# Patient Record
Sex: Female | Born: 2000
Health system: Southern US, Community
[De-identification: ages and names within clinical notes are randomized; demographics above are authoritative.]

## PROBLEM LIST (undated history)

## (undated) DIAGNOSIS — F32A Depression, unspecified: Secondary | ICD-10-CM

## (undated) DIAGNOSIS — F419 Anxiety disorder, unspecified: Secondary | ICD-10-CM

## (undated) DIAGNOSIS — F329 Major depressive disorder, single episode, unspecified: Secondary | ICD-10-CM

## (undated) HISTORY — DX: Depression, unspecified: F32.A

## (undated) HISTORY — PX: NO PAST SURGERIES: SHX2092

---
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
## 2019-01-09 ENCOUNTER — Ambulatory Visit (HOSPITAL_COMMUNITY): Payer: Federal, State, Local not specified - PPO | Admitting: Licensed Clinical Social Worker

## 2019-01-09 DIAGNOSIS — F5102 Adjustment insomnia: Secondary | ICD-10-CM

## 2019-01-09 DIAGNOSIS — Z639 Problem related to primary support group, unspecified: Secondary | ICD-10-CM

## 2019-01-09 DIAGNOSIS — F411 Generalized anxiety disorder: Secondary | ICD-10-CM

## 2019-01-09 DIAGNOSIS — F332 Major depressive disorder, recurrent severe without psychotic features: Secondary | ICD-10-CM

## 2019-01-09 NOTE — Progress Notes (Signed)
Virtual Visit via Video Note  I connected with Leah Cervantes on 01/09/19 at  3:00 PM EDT by a video enabled telemedicine application and verified that I am speaking with the correct person using two identifiers.   I discussed the limitations of evaluation and management by telemedicine and the availability of in person appointments. The patient expressed understanding and agreed to proceed.   Follow Up Instructions:    I discussed the assessment and treatment plan with the patient. The patient was provided an opportunity to ask questions and all were answered. The patient agreed with the plan and demonstrated an understanding of the instructions.   The patient was advised to call back or seek an in-person evaluation if the symptoms worsen or if the condition fails to improve as anticipated.  I provided 56 minutes of non-face-to-face time during this encounter.  THERAPIST PROGRESS NOTE  Session Time: 3:02 PM to 3:58 PM  Participation Level: Active  Behavioral Response: CasualAlertDepressed  Type of Therapy: Individual Therapy  Treatment Goals addressed:  patient will work on communicating her feelings, being honest with herself, compromising with herself, coping  Interventions: Solution Focused, Strength-based, Supportive, Reframing and Other: coping  Summary: Leah Cervantes is a 18 y.o. female who presents with an emotional wreck today. My cat passed away and he was my baby. All of sudden and only two years old. Picked a spot in my friend's back yard. I wrote a note and put in his box, planted flowers and put a stone on it. Will put cat nip in with it when it is in season.  Reviewed what she can do now that will be comforting to her and she is going back to best friend's house. Thre is a guy, Ty, love him but because of issues with Dad can't be with him, important part of my life and he will be there and will comfort me and make me smile.  Patient shared qualities of her  relationship that he is goofy, doesn't accept or judge me, listens to  me, tries to understand me and not upset when I am having a bad day. Understands her based on similarities they have. He knows me so well. Dad doesn't like him because he is half black. Mom and stepdad like him and thinks he treats me well.  Discussed he has spontaneous, and enjoys these times and shared about fun activities they do together.  They have been romantically involved since January but knew each other before that.  I did tell my dad for quite a few months. I knew that it would be an issue. He is older than me, he is 57. Marland Kitchen When I told dad he said very mean things and that I had to choose.  I cut them off for a while because trying to distance myself.  I am good at turning off my emotions when distancing myself. I really missed him, started hanging out again like we never stopped. My dad hasn't asked me about it not an issue until he does. It is what it is. I want to be happy. There are a lot of things in life that making me unhappy.  Discussed cutting incidents and shared she did on Saturday, that was the last time before that it did really good then got a huge argument with stepdad. I am trying for that to be the last time, thought about it since then but happen.  Reviewed strategies to find alternatives and she is agrees that  talking to tie helps her to calm down.  Have been honest with my parents. Reviewed list of what makes patient unhappy and one is that she was fired from her job.  Therapist pointed out last session she shared she hated her job and and was very stressful for her.  Relates mindset switched and enjoyed it again.  Working on self and being around Ty helps. When he saw my arm he was heartbroken. Hugged me, and said stop hurting yourself, stop doing that to yourself. I want to be happy, I don't want feel this way, going to be happy.  I do not like to make him unhappy and I am also doing it for me.  Relates she has  set note to corporate to contest her firing, reviewed other reasons that she had points was having really bad days and could not get out of bed.  Therapist relates how that will be different when she gets her job back.  Shares it is all about mindset and keep it where it needs to be and I will have problems at work.  Instead of it being an emotional toll thinking about every minute and every hour earning money, building blocks from my future,  want future, and want to work hard for it.   Reviewed session and patient shares that she is making steps in the right direction.  She began another discussion at end of session about opening up to parents about something that happened to her and therapist suggested starting that next session so fully could discuss. Suicidal/Homicidal: No  Therapist Response: Therapist assessed patient current functioning per report and processed feelings related to significant stressor of Passing way.  Provided positive feedback for her taking active steps to help her with coping by creating a memorial and varying the cat.  Identified this as a coping strategy in general that is positive the patient uses.  Discussed Memorial's for people and animals as important in helping this grief, memory how important they are not her life also as a way of finding connection with the loss.  Provided positive feedback for patient spending time with supports later today that will provide comfort to help her get through difficult emotions.   Provided positive feedback for patient's progress in working months expressing her emotions, recognizing mindset is important in helping her cope, helping her move away from destructive behaviors.  Reviewed that it is the meaning that we give to an event that affects how we feel, that patient's mind showed at work has shifted toward a more positive, helpful and accurate perspective that will turn her to positive behaviors.  Helpful to do in general to make choices  and cope and ways that are more beneficial for herself and for interactions with others.  ent in making that make happy, choices that are going to be harmful Event but our interpretation that impacts how we are going to feel. Coping by taking action Plan: Return again in 2 weeks.2.  Therapist continue to help patient learn mood regulation skills, utilize sessions to help patient open up about feelings and help with coping skills enhancement  Diagnosis: Axis I:  severe episode of recurrent major depressive disorder without psychotic features, adjustment insomnia, relationship dysfunction, Generalized Anxiety disorder    Axis II: No diagnosis    Coolidge BreezeMary Latona Krichbaum, LCSW 01/09/2019

## 2019-01-24 ENCOUNTER — Ambulatory Visit (INDEPENDENT_AMBULATORY_CARE_PROVIDER_SITE_OTHER): Payer: Federal, State, Local not specified - PPO | Admitting: Licensed Clinical Social Worker

## 2019-01-24 ENCOUNTER — Other Ambulatory Visit: Payer: Self-pay

## 2019-01-24 DIAGNOSIS — F332 Major depressive disorder, recurrent severe without psychotic features: Secondary | ICD-10-CM

## 2019-01-24 DIAGNOSIS — F411 Generalized anxiety disorder: Secondary | ICD-10-CM

## 2019-01-24 DIAGNOSIS — F5102 Adjustment insomnia: Secondary | ICD-10-CM

## 2019-01-24 DIAGNOSIS — Z639 Problem related to primary support group, unspecified: Secondary | ICD-10-CM | POA: Diagnosis not present
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
## 2019-01-25 ENCOUNTER — Encounter: Payer: Self-pay | Admitting: Family Medicine

## 2019-01-25 ENCOUNTER — Other Ambulatory Visit: Payer: Self-pay

## 2019-01-25 ENCOUNTER — Telehealth: Payer: Self-pay

## 2019-01-25 DIAGNOSIS — R6889 Other general symptoms and signs: Secondary | ICD-10-CM | POA: Diagnosis not present

## 2019-01-25 DIAGNOSIS — Z20822 Contact with and (suspected) exposure to covid-19: Secondary | ICD-10-CM

## 2019-01-25 NOTE — Telephone Encounter (Signed)
Leah Cervantes states she has had a possible exposure to COVID-19. I did give her the address to the [facility] testing center. Denies any symptoms.

## 2019-01-25 NOTE — Telephone Encounter (Signed)
Thank you :)

## 2019-01-26 LAB — NOVEL CORONAVIRUS, NAA: SARS-CoV-2, NAA: NOT DETECTED

## 2019-01-27 ENCOUNTER — Encounter: Payer: Self-pay | Admitting: Family Medicine

## 2019-01-28 NOTE — Telephone Encounter (Signed)
Ok for work note to return.

## 2019-01-29 ENCOUNTER — Encounter: Payer: Self-pay | Admitting: Family Medicine

## 2019-02-04 ENCOUNTER — Encounter (HOSPITAL_COMMUNITY): Payer: Self-pay | Admitting: Psychiatry

## 2019-02-04 ENCOUNTER — Ambulatory Visit (INDEPENDENT_AMBULATORY_CARE_PROVIDER_SITE_OTHER): Payer: Federal, State, Local not specified - PPO | Admitting: Psychiatry

## 2019-02-04 DIAGNOSIS — F411 Generalized anxiety disorder: Secondary | ICD-10-CM | POA: Diagnosis not present

## 2019-02-04 DIAGNOSIS — Z639 Problem related to primary support group, unspecified: Secondary | ICD-10-CM | POA: Diagnosis not present

## 2019-02-04 DIAGNOSIS — F5102 Adjustment insomnia: Secondary | ICD-10-CM

## 2019-02-04 DIAGNOSIS — F332 Major depressive disorder, recurrent severe without psychotic features: Secondary | ICD-10-CM

## 2019-02-04 MED ORDER — DULOXETINE HCL 30 MG PO CPEP: 30.0000 mg | ORAL_CAPSULE | Freq: Two times a day (BID) | ORAL | 1 refills | Status: DC

## 2019-02-04 MED ORDER — TRAZODONE HCL 50 MG PO TABS: 50.0000 mg | ORAL_TABLET | Freq: Every evening | ORAL | 0 refills | Status: DC | PRN

## 2019-02-04 NOTE — Progress Notes (Signed)
[facility] Follow up visit   Patient Identification: Leah Cervantes MRN:  [id number] Date of Evaluation:  02/04/2019 Referral Source: primary care Chief Complaint:   depression follow up  Visit Diagnosis:    ICD-10-CM   1. Severe episode of recurrent major depressive disorder, without psychotic features (HCC)  F33.2   2. Adjustment insomnia  F51.02   3. Relationship dysfunction  Z63.9   4. Generalized anxiety disorder  F41.1   .  I connected with Leah Cervantes on 02/04/19 at 10:30 AM EDT by telephone and verified that I am speaking with the correct person using two identifiers.   I discussed the limitations, risks, security and privacy concerns of performing an evaluation and management service by telephone and the availability of in person appointments. I also discussed with the patient that there may be a patient responsible charge related to this service. The patient expressed understanding and agreed to proceed.  History of Present Illness:   Leah Cervantes is a 18 years old currently single Caucasian female living with her mom and at times with her dad   Her mom is deaf .  Doing better since cymbalta increased Not overwhelemed, still difficult with dad to satisfy him in general and now regarding her BF   Energy, mood better   Aggravating factors; difficult relationship with dad.  her stepdad is good with her Modifying factors mom, work  Duration since middle school she has suffered from depression on and off factors being considered and relationship  Family history of depression bipolar Severe depression is better  Denies physical sexual trauma but has difficult time going up between parents  Substance Abuse History in the last 12 months:  Yes.    Consequences of Substance Abuse: uses marijuana sporadically, can effect depression and judjement  Past Medical History: History reviewed. No pertinent past medical history. History reviewed. No pertinent surgical history.  Family  Psychiatric History: Uncle bipolar  Family History:  Family History  Problem Relation Age of Onset  . Hearing loss Mother   . Hypertension Mother   . Fibromyalgia Sister   . Stroke Maternal Grandmother   . Heart disease Maternal Grandmother   . Hypertension Maternal Grandmother   . Fibromyalgia Maternal Grandfather   . Raynaud syndrome Paternal Grandmother   . Heart attack Paternal Grandfather   . Bipolar disorder Maternal Uncle     Social History:   Social History   Socioeconomic History  . Marital status: Single    Spouse name: Not on file  . Number of children: Not on file  . Years of education: Not on file  . Highest education level: Not on file  Occupational History  . Not on file  Social Needs  . Financial resource strain: Not on file  . Food insecurity    Worry: Not on file    Inability: Not on file  . Transportation needs    Medical: Not on file    Non-medical: Not on file  Tobacco Use  . Smoking status: Never Smoker  . Smokeless tobacco: Never Used  Substance and Sexual Activity  . Alcohol use: Yes    Comment: occasional/very rare  . Drug use: No  . Sexual activity: Not on file  Lifestyle  . Physical activity    Days per week: Not on file    Minutes per session: Not on file  . Stress: Not on file  Relationships  . Social connections    Talks on phone: Not on file  Gets together: Not on file    Attends religious service: Not on file    Active member of club or organization: Not on file    Attends meetings of clubs or organizations: Not on file    Relationship status: Not on file  Other Topics Concern  . Not on file  Social History Narrative  . Not on file     Allergies:  No Known Allergies  Metabolic Disorder Labs: Lab Results  Component Value Date   HGBA1C 5.3 11/10/2018   MPG 105.41 11/10/2018   No results found for: PROLACTIN Lab Results  Component Value Date   CHOL 147 11/10/2018   TRIG 122 11/10/2018   HDL 36 (L) 11/10/2018    CHOLHDL 4.1 11/10/2018   VLDL 24 11/10/2018   LDLCALC 87 11/10/2018   Lab Results  Component Value Date   TSH 2.374 11/10/2018    Therapeutic Level Labs: No results found for: LITHIUM No results found for: CBMZ No results found for: VALPROATE  Current Medications: Current Outpatient Medications  Medication Sig Dispense Refill  . DULoxetine (CYMBALTA) 30 MG capsule Take 1 capsule (30 mg total) by mouth 2 (two) times daily. 60 capsule 1  . nicotine (NICODERM CQ - DOSED IN MG/24 HR) 7 mg/24hr patch Place 1 patch (7 mg total) onto the skin daily. 28 patch 0  . traZODone (DESYREL) 50 MG tablet Take 1 tablet (50 mg total) by mouth at bedtime and may repeat dose one time if needed. 30 tablet 0   No current facility-administered medications for this visit.      Psychiatric Specialty Exam: Review of Systems  Cardiovascular: Negative for chest pain.  Psychiatric/Behavioral: Negative for depression and suicidal ideas.    There were no vitals taken for this visit.There is no height or weight on file to calculate BMI.  General Appearance:   Eye Contact:    Speech:  Slow  Volume:  Decreased  Mood:  better  Affect:  Congruent  Thought Process:  Goal Directed  Orientation:  Full (Time, Place, and Person)  Thought Content:  Rumination  Suicidal Thoughts:  No  Homicidal Thoughts:  No  Memory:  Immediate;   Fair Recent;   Fair  Judgement:  Fair  Insight:  Fair  Psychomotor Activity:  Decreased  Concentration:  Concentration: Fair and Attention Span: Fair  Recall:  [place] of Knowledge:Good  Language: Good  Akathisia:  No  Handed:  Right  AIMS (if indicated):  not done  Assets:  Desire for Improvement  ADL's:  Intact  Cognition: WNL  Sleep:  variable   Screenings: AIMS     Admission (Discharged) from OP Visit from 11/09/2018 in [place] [facility]  AIMS Total Score  0    AUDIT     Admission (Discharged) from OP Visit from 11/09/2018 in  [place] [facility]  Alcohol Use Disorder Identification Test Final Score (AUDIT)  4    GAD-7     Office Visit from 08/10/2018 in [facility] Office Visit from 07/04/2017 in [facility] Office Visit from 02/02/2017 in [place] Office Visit from 10/17/2016 in [place] Visit from 08/26/2016 in [place]  Total GAD-7 Score  18  6  21  10  18     PHQ2-9     Office Visit from 08/10/2018  in [facility] Office Visit from 07/04/2017 in [facility] Office Visit from 04/13/2017 in [facility] Office Visit from 02/02/2017 in [facility] Office Visit from 10/17/2016 in [facility]  PHQ-2 Total Score  6  0  4  6  3   PHQ-9 Total Score  26  7  22  25  16       Assessment and Plan: as follows MDD recurrent severe; better continue bid cymbalta marijuana or any use of alcohol which she does uses 2-3 times a month May consider to add remeorn if needed. For now continue cymbalta  Insomnia": doing better on prn trazadone. Will continue And therapy to work on relationship with dad I discussed the assessment and treatment plan with the patient. The patient was provided an opportunity to ask questions and all were answered. The patient agreed with the plan and demonstrated an understanding of the instructions.   The patient was advised to call back or seek an in-person evaluation if the symptoms worsen or if the condition fails to improve as anticipated.  Fu 89m.  Thresa Ross, MD 8/31/202010:38 AM

## 2019-02-28 ENCOUNTER — Ambulatory Visit (INDEPENDENT_AMBULATORY_CARE_PROVIDER_SITE_OTHER): Payer: Federal, State, Local not specified - PPO | Admitting: Licensed Clinical Social Worker

## 2019-02-28 DIAGNOSIS — F332 Major depressive disorder, recurrent severe without psychotic features: Secondary | ICD-10-CM

## 2019-02-28 DIAGNOSIS — F411 Generalized anxiety disorder: Secondary | ICD-10-CM

## 2019-02-28 DIAGNOSIS — Z639 Problem related to primary support group, unspecified: Secondary | ICD-10-CM | POA: Diagnosis not present

## 2019-02-28 DIAGNOSIS — F5102 Adjustment insomnia: Secondary | ICD-10-CM | POA: Diagnosis not present
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
## 2019-03-02 ENCOUNTER — Other Ambulatory Visit (HOSPITAL_COMMUNITY): Payer: Self-pay | Admitting: Psychiatry

## 2019-03-25 ENCOUNTER — Ambulatory Visit (INDEPENDENT_AMBULATORY_CARE_PROVIDER_SITE_OTHER): Payer: Federal, State, Local not specified - PPO | Admitting: Licensed Clinical Social Worker

## 2019-03-25 DIAGNOSIS — F411 Generalized anxiety disorder: Secondary | ICD-10-CM | POA: Diagnosis not present

## 2019-03-25 DIAGNOSIS — F332 Major depressive disorder, recurrent severe without psychotic features: Secondary | ICD-10-CM | POA: Diagnosis not present

## 2019-03-25 DIAGNOSIS — F5102 Adjustment insomnia: Secondary | ICD-10-CM

## 2019-03-25 DIAGNOSIS — Z639 Problem related to primary support group, unspecified: Secondary | ICD-10-CM | POA: Diagnosis not present
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
## 2019-04-04 ENCOUNTER — Encounter (HOSPITAL_COMMUNITY): Payer: Self-pay | Admitting: Psychiatry

## 2019-04-04 ENCOUNTER — Ambulatory Visit (INDEPENDENT_AMBULATORY_CARE_PROVIDER_SITE_OTHER): Payer: Federal, State, Local not specified - PPO | Admitting: Psychiatry

## 2019-04-04 ENCOUNTER — Other Ambulatory Visit: Payer: Self-pay

## 2019-04-04 DIAGNOSIS — F411 Generalized anxiety disorder: Secondary | ICD-10-CM | POA: Diagnosis not present

## 2019-04-04 DIAGNOSIS — F332 Major depressive disorder, recurrent severe without psychotic features: Secondary | ICD-10-CM

## 2019-04-04 DIAGNOSIS — F5102 Adjustment insomnia: Secondary | ICD-10-CM

## 2019-04-04 DIAGNOSIS — Z639 Problem related to primary support group, unspecified: Secondary | ICD-10-CM

## 2019-04-04 MED ORDER — DULOXETINE HCL 30 MG PO CPEP: 30.0000 mg | ORAL_CAPSULE | Freq: Three times a day (TID) | ORAL | 1 refills | Status: DC

## 2019-04-04 NOTE — Progress Notes (Signed)
[facility] Follow up visit   Patient Identification: Leah Cervantes MRN:  [id number] Date of Evaluation:  04/04/2019 Referral Source: primary care Chief Complaint:   depression follow up  Visit Diagnosis:    ICD-10-CM   1. Severe episode of recurrent major depressive disorder, without psychotic features (HCC)  F33.2   2. Adjustment insomnia  F51.02   3. Relationship dysfunction  Z63.9   4. Generalized anxiety disorder  F41.1   .   I connected with [name] on 04/04/19 at 10:30 AM EDT by a video enabled telemedicine application and verified that I am speaking with the correct person using two identifiers.      I discussed the limitations, risks, security and privacy concerns of performing an evaluation and management service by telephone and the availability of in person appointments. I also discussed with the patient that there may be a patient responsible charge related to this service. The patient expressed understanding and agreed to proceed.  History of Present Illness:   Leah Cervantes is a 18 years old currently single Caucasian female living with her mom and at times with her dad   Her mom is deaf .  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  her stepdad is good with her Modifying factors: mom, work Duration since middle school she has suffered from depression on and off factors being considered and relationship  Family history of depression bipolar Severe depression is better  Denies physical sexual trauma but has difficult time going up between parents  Substance Abuse History in the last 12 months:  Yes.    Consequences of Substance Abuse: uses marijuana sporadically, can effect depression and judjement  Past Medical History:  History reviewed. No pertinent past medical history. History reviewed. No pertinent surgical history.  Family Psychiatric History: Uncle bipolar  Family History:  Family History  Problem Relation Age of Onset  . Hearing loss Mother   . Hypertension Mother   . Fibromyalgia Sister   . Stroke Maternal Grandmother   . Heart disease Maternal Grandmother   . Hypertension Maternal Grandmother   . Fibromyalgia Maternal Grandfather   . Raynaud syndrome Paternal Grandmother   . Heart attack Paternal Grandfather   . Bipolar disorder Maternal Uncle     Social History:   Social History   Socioeconomic History  . Marital status: Single    Spouse name: Not on file  . Number of children: Not on file  . Years of education: Not on file  . Highest education level: Not on file  Occupational History  . Not on file  Social Needs  . Financial resource strain: Not on file  . Food insecurity    Worry: Not on file    Inability: Not on file  . Transportation needs    Medical: Not on file    Non-medical: Not on file  Tobacco Use  . Smoking status: Never Smoker  . Smokeless tobacco: Never Used  Substance and Sexual Activity  . Alcohol use: Yes    Comment: occasional/very rare  . Drug use: No  . Sexual activity: Not on file  Lifestyle  . Physical activity    Days per week: Not on file    Minutes per session: Not on file  . Stress: Not on  file  Relationships  . Social Herbalist on phone: Not on file    Gets together: Not on file    Attends religious service: Not on file    Active member of club or organization: Not on file    Attends meetings of clubs or organizations: Not on file    Relationship status: Not on file  Other Topics Concern  . Not on file  Social History Narrative  . Not on file     Allergies:  No Known Allergies  Metabolic Disorder Labs: Lab Results  Component Value Date   HGBA1C 5.3 11/10/2018   MPG 105.41 11/10/2018   No results found for:  PROLACTIN Lab Results  Component Value Date   CHOL 147 11/10/2018   TRIG 122 11/10/2018   HDL 36 (L) 11/10/2018   CHOLHDL 4.1 11/10/2018   VLDL 24 11/10/2018   LDLCALC 87 11/10/2018   Lab Results  Component Value Date   TSH 2.374 11/10/2018    Therapeutic Level Labs: No results found for: LITHIUM No results found for: CBMZ No results found for: VALPROATE  Current Medications: Current Outpatient Medications  Medication Sig Dispense Refill  . DULoxetine (CYMBALTA) 30 MG capsule Take 1 capsule (30 mg total) by mouth 3 (three) times daily. 90 capsule 1  . nicotine (NICODERM CQ - DOSED IN MG/24 HR) 7 mg/24hr patch Place 1 patch (7 mg total) onto the skin daily. 28 patch 0  . traZODone (DESYREL) 50 MG tablet TAKE 1 TABLET (50 MG TOTAL) BY MOUTH AT BEDTIME AND MAY REPEAT DOSE ONE TIME IF NEEDED. 30 tablet 0   No current facility-administered medications for this visit.      Psychiatric Specialty Exam: Review of Systems  Cardiovascular: Negative for chest pain.  Psychiatric/Behavioral: Negative for suicidal ideas. The patient is nervous/anxious.     There were no vitals taken for this visit.There is no height or weight on file to calculate BMI.  General Appearance:   Eye Contact:    Speech:  Slow  Volume:  Decreased  Mood:  Somewhat stressed  Affect:  Congruent  Thought Process:  Goal Directed  Orientation:  Full (Time, Place, and Person)  Thought Content:  Rumination  Suicidal Thoughts:  No  Homicidal Thoughts:  No  Memory:  Immediate;   Fair Recent;   Fair  Judgement:  Fair  Insight:  Fair  Psychomotor Activity:  Decreased  Concentration:  Concentration: Fair and Attention Span: Fair  Recall:  [employer] of Knowledge:Good  Language: Good  Akathisia:  No  Handed:  Right  AIMS (if indicated):  not done  Assets:  Desire for Improvement  ADL's:  Intact  Cognition: WNL  Sleep:  variable   Screenings: AIMS     Admission (Discharged) from OP Visit from 11/09/2018  in [place] [facility]  AIMS Total Score  0    AUDIT     Admission (Discharged) from OP Visit from 11/09/2018 in [place] [facility]  Alcohol Use Disorder Identification Test Final Score (AUDIT)  4    GAD-7     Office Visit from 08/10/2018 in [facility] Office Visit from 07/04/2017 in [facility] Office Visit from 02/02/2017 in [place] Office Visit from 10/17/2016 in [place] Visit from 08/26/2016 in [place]  Total GAD-7  Score  18  6  21  10  18     PHQ2-9     [facility] [place] [facility] [place] [facility] [place] [facility] [place] [facility] [place]  PHQ-2 Total Score  6  0  4  6  3   PHQ-9 Total Score  26  7  22  25  16       Assessment and Plan: as follows MDD recurrent severe; somewhat subdued, increase cymbalta to 30mg  tid Abstain from marijuana or any use of alcohol which she does uses 2-3 times a month May consider to add remeorn if needed.  Insomnia": doing better on prn trazadone. Will continue and can ask for refills Continue therapy to work on past incident and relationship with dad  I discussed the assessment and treatment plan with the patient. The patient was provided an opportunity to ask questions and all were answered. The patient agreed with the plan and demonstrated an understanding of the instructions.   The patient was advised to call back or seek an in-person evaluation if the symptoms worsen or if the condition fails to improve as  anticipated. Fu [id number].  Thresa RossNadeem Audriella Blakeley, MD 10/29/202010:43 AM

## 2019-04-17 ENCOUNTER — Other Ambulatory Visit: Payer: Self-pay

## 2019-04-17 ENCOUNTER — Ambulatory Visit (HOSPITAL_COMMUNITY): Payer: Federal, State, Local not specified - PPO | Admitting: Licensed Clinical Social Worker

## 2019-04-30 ENCOUNTER — Encounter: Payer: Self-pay | Admitting: Osteopathic Medicine

## 2019-04-30 ENCOUNTER — Ambulatory Visit (INDEPENDENT_AMBULATORY_CARE_PROVIDER_SITE_OTHER): Payer: Federal, State, Local not specified - PPO | Admitting: Osteopathic Medicine

## 2019-04-30 ENCOUNTER — Other Ambulatory Visit: Payer: Self-pay

## 2019-04-30 ENCOUNTER — Ambulatory Visit (INDEPENDENT_AMBULATORY_CARE_PROVIDER_SITE_OTHER): Payer: Federal, State, Local not specified - PPO

## 2019-04-30 VITALS — BP 121/80 | HR 97 | Temp 98.3°F | Wt 234.0 lb

## 2019-04-30 DIAGNOSIS — S39012A Strain of muscle, fascia and tendon of lower back, initial encounter: Secondary | ICD-10-CM

## 2019-04-30 DIAGNOSIS — M545 Low back pain: Secondary | ICD-10-CM | POA: Diagnosis not present

## 2019-04-30 DIAGNOSIS — Z23 Encounter for immunization: Secondary | ICD-10-CM | POA: Diagnosis not present

## 2019-04-30 MED ORDER — NAPROXEN 500 MG PO TABS: 500.0000 mg | ORAL_TABLET | Freq: Two times a day (BID) | ORAL | 1 refills | Status: DC

## 2019-04-30 MED ORDER — CYCLOBENZAPRINE HCL 10 MG PO TABS: 5.0000 mg | ORAL_TABLET | Freq: Three times a day (TID) | ORAL | 1 refills | Status: DC | PRN

## 2019-04-30 MED ORDER — PREDNISONE 10 MG (48) PO TBPK: ORAL_TABLET | Freq: Every day | ORAL | 0 refills | Status: DC

## 2019-04-30 NOTE — Progress Notes (Signed)
HPI: Leah Cervantes is a 18 y.o. female who  has no past medical history on file.  she presents to [facility] today, 04/30/19,  for chief complaint of:  Back pain   . 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 Location: lower back, cental, radiating laterally, worse on L  . Quality: soreness/sharp pain, NO numbness or weakness . Severity: so bad she was unable to work on Sunday, has been having bad days more frequently . Duration: 1.5 months total . Modifying factors: Advil, Motrin not helping . Assoc signs/symptoms: numbness/tingling     At today's visit 04/30/19 ... PMH, PSH, FH reviewed and updated as needed.  Current medication list and allergy/intolerance hx reviewed and updated as needed. (See remainder of HPI, ROS, Phys Exam below)   No results found.  No results found for this or any previous visit (from the past 72 hour(s)).        ASSESSMENT/PLAN: The primary encounter diagnosis was Strain of lumbar region, initial encounter. A diagnosis of Need for influenza vaccination was also pertinent to this visit.   No alarm symptoms at this time  Pt to alert [place] if worse/change  Written out of work for several days  Consider restricted duty to avoid lifting   Home exercises given, would probably recommend formal PT if pt ok with it but she'd like to defer for now   Orders Placed This Encounter  Procedures  . DG Lumbar Spine Complete  . DG Si Joints  . Flu Vaccine QUAD 6+ mos PF IM (Fluarix Quad PF)      Patient Instructions   If BACK PAIN is not better or if it gets worse despite medications and home exercises, let me know -- we might need to get you set up for formal physical therapy / possibly MRI.    Meds ordered this encounter  Medications  . cyclobenzaprine (FLEXERIL) 10 MG tablet = MUSCLE RELAXER    Sig: Take 0.5-1 tablets (5-10 mg total) by mouth 3 (three)  times daily as needed for muscle spasms. Caution: can cause drowsiness    Dispense:  60 tablet    Refill:  1  . naproxen (NAPROSYN) 500 MG tablet = ANTI-INFLAMMATORY    Sig: Take 1 tablet (500 mg total) by mouth 2 (two) times daily with a meal. Take every day for one week, then use as needed after that    Dispense:  60 tablet    Refill:  1  . predniSONE (STERAPRED UNI-PAK 48 TAB) 10 MG (48) TBPK tablet = STEROIDS     Sig: Take by mouth daily. 12-Day taper, po    Dispense:  48 tablet    Refill:  0           Follow-up plan: Return if symptoms worsen or fail to improve.                                                 ################################################# ################################################# ################################################# #################################################    No outpatient medications have been marked as taking for the 04/30/19 encounter (Appointment) with Sunnie Nielsen, DO.    No Known Allergies     Review of Systems:  Constitutional: No recent illness  Respiratory:  No  shortness of breath.   Gastrointestinal: No  abdominal pain, no change on bowel habits   Musculoskeletal: +new myalgia/arthralgia  Skin: No  Rash  Neurologic: No  weakness, No  Dizziness   Exam:  BP 121/80 (BP Location: Left Arm, Patient Position: Sitting, Cuff Size: Normal)   Pulse 97   Temp 98.3 F (36.8 C) (Oral)   Wt 234 lb 0.6 oz (106.2 kg)   BMI 36.66 kg/m   Constitutional: VS see above. General Appearance: alert, well-developed, well-nourished, NAD  Eyes: Normal lids and conjunctive, non-icteric sclera  Ears, Nose, Mouth, Throat: MMM, Normal external inspection ears/nares/mouth/lips/gums.  Neck: No masses, trachea midline.   Respiratory: Normal respiratory effort. no wheeze, no rhonchi, no rales  Cardiovascular: S1/S2 normal, no murmur, no rub/gallop auscultated. RRR.    Musculoskeletal: Gait normal. Symmetric and independent movement of all extremities, neg SLR bilaterally, normal strength and symmetric hip flexion, knee flexion and extension against resistance. Neg FABER/FADIR bilaterally. (+)tenderness SI joints bilaterally  Neurological: Normal balance/coordination. No tremor.  Skin: warm, dry, intact.   Psychiatric: Normal judgment/insight. Normal mood and affect. Oriented x3.       Visit summary with medication list and pertinent instructions was printed for patient to review, patient was advised to alert [place] if any updates are needed. All questions at time of visit were answered - patient instructed to contact office with any additional concerns. ER/RTC precautions were reviewed with the patient and understanding verbalized.      Please note: voice recognition software was used to produce this document, and typos may escape review. Please contact Dr. Sheppard Coil for any needed clarifications.    Follow up plan: Return if symptoms worsen or fail to improve.

## 2019-04-30 NOTE — Patient Instructions (Addendum)
If BACK PAIN is not better or if it gets worse despite medications and home exercises, let me know -- we might need to get you set up for formal physical therapy / possibly MRI.    Meds ordered this encounter  Medications  . cyclobenzaprine (FLEXERIL) 10 MG tablet = MUSCLE RELAXER    Sig: Take 0.5-1 tablets (5-10 mg total) by mouth 3 (three) times daily as needed for muscle spasms. Caution: can cause drowsiness    Dispense:  60 tablet    Refill:  1  . naproxen (NAPROSYN) 500 MG tablet = ANTI-INFLAMMATORY    Sig: Take 1 tablet (500 mg total) by mouth 2 (two) times daily with a meal. Take every day for one week, then use as needed after that    Dispense:  60 tablet    Refill:  1  . predniSONE (STERAPRED UNI-PAK 48 TAB) 10 MG (48) TBPK tablet = STEROIDS     Sig: Take by mouth daily. 12-Day taper, po    Dispense:  48 tablet    Refill:  0

## 2019-05-07 ENCOUNTER — Ambulatory Visit (INDEPENDENT_AMBULATORY_CARE_PROVIDER_SITE_OTHER): Payer: Federal, State, Local not specified - PPO | Admitting: Licensed Clinical Social Worker

## 2019-05-07 DIAGNOSIS — F5102 Adjustment insomnia: Secondary | ICD-10-CM

## 2019-05-07 DIAGNOSIS — F411 Generalized anxiety disorder: Secondary | ICD-10-CM | POA: Diagnosis not present

## 2019-05-07 DIAGNOSIS — Z639 Problem related to primary support group, unspecified: Secondary | ICD-10-CM

## 2019-05-07 DIAGNOSIS — F332 Major depressive disorder, recurrent severe without psychotic features: Secondary | ICD-10-CM | POA: Diagnosis not present

## 2019-05-07 NOTE — Progress Notes (Signed)
Virtual Visit via Video Note  I connected with Leah Cervantes on 05/07/19 at 10:00 AM EST by a video enabled telemedicine application and verified that I am speaking with the correct person using two identifiers.   I discussed the limitations of evaluation and management by telemedicine and the availability of in person appointments. The patient expressed understanding and agreed to proceed.   I discussed the assessment and treatment plan with the patient. The patient was provided an opportunity to ask questions and all were answered. The patient agreed with the plan and demonstrated an understanding of the instructions.   The patient was advised to call back or seek an in-person evaluation if the symptoms worsen or if the condition fails to improve as anticipated.  I provided 55 minutes of non-face-to-face time during this encounter.   THERAPIST PROGRESS NOTE  Session Time: 10:02 AM to 10:57 AM  Participation Level: Active  Behavioral Response: CasualAlertDysphoric and Euthymic  Type of Therapy: Individual Therapy  Treatment Goals addressed:  patient will work on communicating her feelings, being honest with herself, compromising with herself, coping  Interventions: Solution Focused, Strength-based, Supportive and Other: coping, grief  Summary: Leah Cervantes is a 18 y.o. female who presents with uncle passed unexpectedly. Step dad's brother. Four weeks before that great grandmother passed. 12 mentor passed a week later after great grandmother. Uncle had a Youth worker it was very beautiful. It was a lot. Spent three weeks trying to be there for step dad. Haven't seen him like that crying. Be there for grandparents and aunt. He touched anybody who he came contact with and was positive influence on them. Painted picture broke down when doing it and showed it to therapist. Therapist pointed out that her grieving, expressing her emotions is part of the process of  grieving that will help her to work through it. Patient recognizes this as a  healthy way of coping. It has been hard to keep up with classes. Focused on  the class that was a requirement so it was not a total loss. Thinking of taking a semester of needing to recharge. One of the reasons didn't do well in one class was because didn't have book and when could afford it couldn't find it. Related that this year has been a lot. Dealing with school, several family deaths, whole situation with sister, starting a new job. [place] job. 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 Look forward going to work. Very therapeutic and learning something every day. Describes feeling that "my faith is high".  Maybe piece of uncle with her. Weight on the world not all on her shoulders. Got a job, love it and move her up in the company. Boyfriend got a job. Friend recommended patient for a job. A friend haven't seen in two years. Feels faith very helpful, sees ways getting support in her life. Discussed patient's spontaneous quality. Discussed positive aspects on that for her. Reviewed if this causes problems when upset. Shares I am the kind of person who links certain things with certain emotions. Bad mood to herself, slow down and reevaluate things. Good mood spontaneous. Every day, every experience add to foundation of future and want to be successful. Boyfriend supportive about experiences being building blocks. Think about moving out with things at her house the  stress has been high. They are still treating her like a kid. No privacy. I want to move forward. I wanted to be treated as an adult. Wants to take spring  semester off and go back in the fall, mental health break and save money. Wants to go to school and be successful. 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 Can come in handy in moving up in the company. This job found niche. 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 Will have to stop taking sleep medicine nights working because can't wake up in the morning. Concerned about going to sleep. Doctor told her to take as needed and doctor doesn't know can't wake up. Brain doesn't stop at night. Reviewed strategies that can help her. Listens to rain sounds at night. Best with sleeping with boyfriend who scratches her back. He makes her feel safe and comfortable. He will talk to her until she falls asleep, his voice soothing. Reviewed session and patient shares reinforced good habits, celebration of steps well taken, approaching things in a mature way    Suicidal/Homicidal: No  Therapist Response: Therapist offered supportive, open questions, active listening and assessed patient applying healthy coping skills.  Provided education on grief process, as part is excepting loss, grieving the loss, allowing herself to express and release emotions then finding ways to help her move through the process through rituals, through ways she begins to look at the loss and identified ways patient has taken steps to channel energy in constructive ways, helping her through grief process.  Reviewed helpfulness of patient seeing that her uncle is still present and part of her.  Utilize strength-based and putting outpatients caring for her family through the grief process, noting faith is helpful for her coping.  Work with patient on reviewing future plans providing positive feedback that she is thinking them through which shows good coping and maturity, reviewed with patient to help her in her own insight over what would be best for her.  In general facilitated expression of thoughts and feelings, discussed stressors and reviewed symptoms.  Plan: Return again in 4-5 weeks.  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

## 2019-06-12 ENCOUNTER — Ambulatory Visit (INDEPENDENT_AMBULATORY_CARE_PROVIDER_SITE_OTHER): Payer: Federal, State, Local not specified - PPO | Admitting: Licensed Clinical Social Worker

## 2019-06-12 DIAGNOSIS — F5102 Adjustment insomnia: Secondary | ICD-10-CM

## 2019-06-12 DIAGNOSIS — F411 Generalized anxiety disorder: Secondary | ICD-10-CM | POA: Diagnosis not present

## 2019-06-12 DIAGNOSIS — F332 Major depressive disorder, recurrent severe without psychotic features: Secondary | ICD-10-CM | POA: Diagnosis not present

## 2019-06-12 NOTE — Progress Notes (Signed)
Virtual Visit via Video Note  I connected with Leah Cervantes on 06/12/19 at 10:00 AM EST by a video enabled telemedicine application and verified that I am speaking with the correct person using two identifiers.   I discussed the limitations of evaluation and management by telemedicine and the availability of in person appointments. The patient expressed understanding and agreed to proceed.  I discussed the assessment and treatment plan with the patient. The patient was provided an opportunity to ask questions and all were answered. The patient agreed with the plan and demonstrated an understanding of the instructions.   The patient was advised to call back or seek an in-person evaluation if the symptoms worsen or if the condition fails to improve as anticipated.  I provided 54 minutes of non-face-to-face time during this encounter.  THERAPIST PROGRESS NOTE  Session Time: 10:01 AM to 10:55 AM  Participation Level: Active  Behavioral Response: CasualAlertEuthymic  Type of Therapy: Individual Therapy  Treatment Goals addressed:  patient will work on communicating her feelings, being honest with herself, compromising with herself, coping  Interventions: Solution Focused, Strength-based, Supportive and Other: self-esteem, coping  Summary: Leah Cervantes is a 19 y.o. female who presents with discussed being involved in drama and worked as Pensions consultant, enjoying experience but also when she is in she is all in, giving it long hours. Why it would be hard to do now with her working so much. Still loves her job. Things are better with dad. I try to see him more but busy so sees once a week to once every two weeks to visit. Discussed living in mom's house has to respect their rules but mom doesn't respect her privacy. When she is not there she goes through her stuff. Patient has plans to move out. Discussed some of the financial stressors of moving out on her own. Patient has been doing research  to find places affordable and plan is to move out.  Discussed therapy as patient working on herself. Patient relates she has been setting weekly goals. Draw picture, be in bed at 11:00 AM, take medication, work on quote wall she looks at when she is feeling down. Shared one of her favorite quotes "Take care of your body it is the only place you have to live." "I struggled all my life is being mean to myself mentally, physically." Discussed issue of valuing herself. Working on it and thinks will work on herself the rest of her life. Doesn't think anyone has 100% good self-esteem . Still have days. Patient relates she is so hard on herself all the time, wants to be her best all the time and sometimes thinks she could be doing better. Gave an example of being at the back of store very upset because thinks messing up and boss has to comfort her that this was not the case. Discussed self-compassion, and patient shares she doesn't want to get to the point where didn't try enough. Wants a happy medium but can't turn off light switch of brain. Shared with therapist some position things in her life such as a book called the "omen's Devotional for lasting peace in a busy life. Every day read a couple of pages, has a quote and reflection. Dad got her a Bible and want to start going to church.  Patient connected therapy with positives, thinks that therapy may help her focus on positives which are helpful for her and related they are both positive and negatives and it is what you  decide to focus on.  Discussed thought of moving in with boyfriend and recognizes that she will make mistakes, does not go out of her way to have them happen but recognizes they will, therapist discussed her mistakes are part of growth in many different settings  Suicidal/Homicidal: No  Therapist Response: Therapist offered supportive, open questions, active listening, facilitated expression of thoughts and feelings.  Pointed out to patient that  therapy has become a place for her to work on herself to help her continue to grow.  Identified working on self-esteem issues, pointing out resources patient have to help guide her in working on self-esteem such as her putting quotes up, that she read that helps mood encourages strategies for self-esteem.  Therapist referred to 1 of patient's favorite quote to remind her to take care of her body, discussing this quote helps her in appreciating herself and that it also self-esteem continues to grown when we are talking care of ourself.  Discussed growth mindset and how making mistakes are part of growth assessed patient having a good understanding of this.  Reviewed in session other coping strategies helpful for patient.  Reviewed self compassion is helpful for coping for patient to be kinder to herself just says she would be the same way with someone else to be helpful helpful to be that way with herself.  Self compassion shown through self appreciation, being less hard on herself will help her navigate life in a way where she can find happiness, at the same time pointing out patient's strength is also very helpful of wanting always to do her best for her life.  Courage combining the 2.  Identified positive developments in patient's life that have helped improve mood, identified patient's qualities and strengths that brought this about.  Assess needing to continue to work on self-esteem with patient.  Utilize reframing to discuss appreciation of the brain as the space that we live her life and taking care of it that will help with health of brain. Provided supportive interventions.   Plan: Return again in 4 weeks.2.  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

## 2019-06-14 ENCOUNTER — Other Ambulatory Visit (HOSPITAL_COMMUNITY): Payer: Self-pay | Admitting: Psychiatry

## 2019-07-02 DIAGNOSIS — Z20822 Contact with and (suspected) exposure to covid-19: Secondary | ICD-10-CM | POA: Diagnosis not present

## 2019-07-02 DIAGNOSIS — Z20828 Contact with and (suspected) exposure to other viral communicable diseases: Secondary | ICD-10-CM | POA: Diagnosis not present

## 2019-07-08 ENCOUNTER — Encounter: Payer: Self-pay | Admitting: Family Medicine

## 2019-07-08 ENCOUNTER — Telehealth (INDEPENDENT_AMBULATORY_CARE_PROVIDER_SITE_OTHER): Payer: Federal, State, Local not specified - PPO | Admitting: Family Medicine

## 2019-07-08 VITALS — Wt 226.0 lb

## 2019-07-08 DIAGNOSIS — J019 Acute sinusitis, unspecified: Secondary | ICD-10-CM

## 2019-07-08 MED ORDER — AMOXICILLIN-POT CLAVULANATE 875-125 MG PO TABS: 1.0000 | ORAL_TABLET | Freq: Two times a day (BID) | ORAL | 0 refills | Status: DC

## 2019-07-08 NOTE — Progress Notes (Signed)
SX started a week ago. Negative COVID results Thursday 06/27/19. Patient states her throat is very painful, she has no voice, coughing. Reports a lot of congestion. Taking Mucinex & advil, minor help

## 2019-07-08 NOTE — Progress Notes (Signed)
Virtual Visit via Video Note  I connected with Leah Cervantes on 07/08/19 at  9:50 AM EST by a video enabled telemedicine application and verified that I am speaking with the correct person using two identifiers.   I discussed the limitations of evaluation and management by telemedicine and the availability of in person appointments. The patient expressed understanding and agreed to proceed.  Subjective:    CC: URI and cough   HPI: SX started a week ago. Negative COVID results Thursday 06/27/19. Patient states her throat is very painful, she has no voice, coughing. Reports a lot of congestion. Taking Mucinex & advil, minor help. Painful to swallow or talk. No checked for fever. No GI sxs.  Has been having bad HA and pulsing behind her eyes. Pain is so severe she has voiced. Cough is productive, some mild SOB.     Past medical history, Surgical history, Family history not pertinant except as noted below, Social history, Allergies, and medications have been entered into the medical record, reviewed, and corrections made.   Review of Systems: No fevers, chills, night sweats, weight loss, chest pain, or shortness of breath.   Objective:    General: Speaking clearly in complete sentences without any shortness of breath.  Alert and oriented x3.  Normal judgment. No apparent acute distress. Voice is raspy.     Impression and Recommendations:   Acute sinusitis - Will treat with Augmentin. Call if not improving by the end of the week.  Work note given. OK for cough and codl medication. OTC for symptom control.  F/U if not improving or if getting SOB or develop fever or new sxs.         I discussed the assessment and treatment plan with the patient. The patient was provided an opportunity to ask questions and all were answered. The patient agreed with the plan and demonstrated an understanding of the instructions.   The patient was advised to call back or seek an in-person evaluation if the  symptoms worsen or if the condition fails to improve as anticipated.   [name], MD

## 2019-07-10 ENCOUNTER — Other Ambulatory Visit: Payer: Self-pay

## 2019-07-10 ENCOUNTER — Ambulatory Visit (INDEPENDENT_AMBULATORY_CARE_PROVIDER_SITE_OTHER): Payer: Federal, State, Local not specified - PPO | Admitting: Licensed Clinical Social Worker

## 2019-07-10 DIAGNOSIS — F331 Major depressive disorder, recurrent, moderate: Secondary | ICD-10-CM | POA: Diagnosis not present

## 2019-07-10 DIAGNOSIS — F5102 Adjustment insomnia: Secondary | ICD-10-CM

## 2019-07-10 DIAGNOSIS — F411 Generalized anxiety disorder: Secondary | ICD-10-CM

## 2019-07-10 NOTE — Progress Notes (Signed)
Virtual Visit via Video Note  I connected with [name] on 07/10/19 at 10:00 AM EST by a video enabled telemedicine application and verified that I am speaking with the correct person using two identifiers.   I discussed the limitations of evaluation and management by telemedicine and the availability of in person appointments. The patient expressed understanding and agreed to proceed.  History of Present Illness:    Observations/Objective:   Assessment and Plan:   Follow Up Instructions:    I discussed the assessment and treatment plan with the patient. The patient was provided an opportunity to ask questions and all were answered. The patient agreed with the plan and demonstrated an understanding of the instructions.   The patient was advised to call back or seek an in-person evaluation if the symptoms worsen or if the condition fails to improve as anticipated.  I provided 55 minutes of non-face-to-face time during this encounter.  THERAPIST PROGRESS NOTE  Session Time: 10:00 AM to 10:55 AM  Participation Level: Active  Behavioral Response: CasualAlertEuthymic  Type of Therapy: Individual Therapy  Treatment Goals addressed: patient will work on communicating her feelings, being honest with herself, compromising with herself, coping  Interventions: CBT, Solution Focused, Strength-based, Supportive and Other: coping, self-esteem  Summary: Leah Cervantes is a 19 y.o. female who presents with fixed up room.  Showed therapist Leah Cervantes quotes that she reads different colors with different quotes on daily discussed this is very helpful for coping.  She has a body mirror and what she wants to do is write affirmations such as you are beautiful and lovely and change it up everyday, put it next to mirror. Explains she has one positive affirmation before she goes out in the world. Shared her insight that what you put in your mind is what you create. If you look at things negatively  will create negatively and think positively create more positive. Have been upbeat and has been working. New tattoo. Put it on arm would always self-harm. Explains her tattoo. It is a chemical compound for serotonin key for mood stability feelings of wellbeing, happiness, good sleep and good eating habit. Lacked lately but getting better. Also on tattoo is honey bee and she shares she loves them that they are so strong. Shares "Ink therapy." Reviewed doctor telling her that she has a chemical imbalance.  When not taking medication can feel fluctuation knows chemical inbalnce. Showed therapist promise ring from boyfriend and things continue to be good in relationship. Moving in with best friend, Leah Cervantes, as well as boyfriend, Leah Cervantes. She feels it will give her the college experience and also testing waters if we can live together. Will be fun and will be a good learning experience. Wants freedom and accountability. "Final chapter" Excited but also close to home. Mom being supportive and that surprises her. Haven't smoked a cigarette in a month. Discussed not spending the money. Can spend money on other things such tattoos. Leah Cervantes guy friend, grew up since 62 year old, close and dad hated him. He gets out of jail in 7 days in jail for 8 months. Will hang out with patient who is a good influence. Patient has been super sick. Sinus infection, mucus build up, sore throat, get so bad that can get pulsing headaches. Afraid to go to sleep because afraid throw up and die. On antibiotics. Going on two weeks. Doctor wrote her out until this Friday, give her a chance to get antibiotics in system point where can  breath. Better in the day then night, wake up every hour throw up mucous. Discussed problems because of not sleeping. Patient describes doesn't sleep but hibernates. If pain, if tired, if bored go to sleep. When sleep patient knows our mind processing what has been happening during our day and our life to make sense of it. My  mind is always going it is a nice pause and reset. Dreams when dreams has nightmares, not as frequent. Period of first year of freshman year three months straight of nightmares probably really depressed. Just ended middle school worst year of life bullied by kids and teachers. Decided to move out of mom's and move into dad's. Mom didn't take it well and she was unkind to me verbally. Not on good terms wiht mom, extremely depressed with kids in middle school and whole new school  Kept in and took it out on myself.  Discussed difficulty when one's peers or not developed emotionally cognitively and socially causing problems and interaction also 1 zone coping is not developed so can be difficult..  It was pointed out one of her focus is self-esteem patient added "accept mistakes and move on"         Suicidal/Homicidal: No  Therapist Response: Therapist reviewed symptoms, discussed stressors, facilitated expression of thoughts and feelings.  Noted patient's continued progress as she continues to implement habits that support increasing self-esteem and improvement in mood.  Reviewed some of the strategies include affirmations therapist provided education and relating this is very effective for self-esteem that she is creating new habits of thought and needs new habits of thought will help her feel better, also create changes in brain for new neuro channels, explained affirmations as a very good strategy for increasing self-esteem.  Reviewed moving out as developmentally appropriate that she is at the stage where she is gaining independence from parents so healthy for her to take a step to establish more independence.  Provided strength based importing outpatient is high achiever and responsible so she has skills that cause her to be ready to be on her own.  Pointed out positive patient being a good supportive friend, noting this helps with mood also a strength of helping other people.  Patient also recognizing insight  that what she thinks is what she creates that motivates her to remain positive.  Provided positive feedback for patient stopping smoking also noting this is something to feel good about, causes patient to be happy increases positive things in her life as she is able to spend money on other things.  Added other self-esteem strategies including keeping promises to oneself, self compassion and also agreeing with patient excepting mistakes and moving on pointing out we are human and we are going to make mistakes.  Therapist provided supportive intervention  Plan: Return in 4 weeks. 2.Patient continue to learn and implement effective coping strategies for mood, self-esteem. 3.Therapist continue to work with patient on mood, self-esteem.  Diagnosis: Axis I: Moderate episode of recurrent major depressive disorder without psychotic features, adjustment insomnia, Generalized Anxiety disorder   Axis II: No diagnosis    Cordella Register, LCSW 07/10/2019

## 2019-07-16 ENCOUNTER — Other Ambulatory Visit (HOSPITAL_COMMUNITY): Payer: Self-pay | Admitting: Psychiatry

## 2019-07-18 ENCOUNTER — Encounter: Payer: Self-pay | Admitting: Family Medicine

## 2019-07-19 MED ORDER — DULOXETINE HCL 30 MG PO CPEP: 30.0000 mg | ORAL_CAPSULE | Freq: Three times a day (TID) | ORAL | 1 refills | Status: DC

## 2019-08-01 ENCOUNTER — Telehealth (INDEPENDENT_AMBULATORY_CARE_PROVIDER_SITE_OTHER): Payer: Federal, State, Local not specified - PPO | Admitting: Family Medicine

## 2019-08-01 ENCOUNTER — Encounter: Payer: Self-pay | Admitting: Family Medicine

## 2019-08-01 DIAGNOSIS — F339 Major depressive disorder, recurrent, unspecified: Secondary | ICD-10-CM

## 2019-08-01 DIAGNOSIS — Z30011 Encounter for initial prescription of contraceptive pills: Secondary | ICD-10-CM | POA: Insufficient documentation

## 2019-08-01 DIAGNOSIS — F411 Generalized anxiety disorder: Secondary | ICD-10-CM

## 2019-08-01 MED ORDER — LO LOESTRIN FE 1 MG-10 MCG / 10 MCG PO TABS: 1.0000 | ORAL_TABLET | Freq: Every day | ORAL | 11 refills | Status: DC

## 2019-08-01 NOTE — Assessment & Plan Note (Signed)
Doing really well 90 mg Cymbalta daily.  Continue current regimen.  Plan will be to follow-up in 6 months unless she feels that we need to make any adjustments.  Continue with any therapy/counseling.

## 2019-08-01 NOTE — Progress Notes (Signed)
Virtual Visit via Video Note  I connected with Leah Cervantes on 08/01/19 at  1:00 PM EST by a video enabled telemedicine application and verified that I am speaking with the correct person using two identifiers.   I discussed the limitations of evaluation and management by telemedicine and the availability of in person appointments. The patient expressed understanding and agreed to proceed.  Subjective:    CC:   HPI: F/U depression anxiety - she feels like she is sleeping OK.  Has moved out of her parents home to [place] with her roommate and boyfriend.  She says the move has been good and it is exciting but at the same time a little bit stressful.  She feels that the medication is working well and denies any side effects or problems.  She is been on the 90 mg dose for a while and feels like it is effective..  She is still working and going to school..   She is also interested in talking about birth control options she is particularly interested in low Loestrin.  She has not taken birth control before.  And she is sexually active.   Past medical history, Surgical history, Family history not pertinant except as noted below, Social history, Allergies, and medications have been entered into the medical record, reviewed, and corrections made.   Review of Systems: No fevers, chills, night sweats, weight loss, chest pain, or shortness of breath.   Objective:    General: Speaking clearly in complete sentences without any shortness of breath.  Alert and oriented x3.  Normal judgment. No apparent acute distress.    Impression and Recommendations:    Major depression, recurrent, chronic (HCC) Doing really well 90 mg Cymbalta daily.  Continue current regimen.  Plan will be to follow-up in 6 months unless she feels that we need to make any adjustments.  Continue with any therapy/counseling.  Encounter for initial prescription of contraceptive pills Discussed options.  She would like to start  with low Loestrin.  Discussed with her how to start the medication and potential side effects.  Monitor for any sign of blood clot.  Will need to check her blood pressure at some point on the medication.      Time spent in encounter 22 minutes  I discussed the assessment and treatment plan with the patient. The patient was provided an opportunity to ask questions and all were answered. The patient agreed with the plan and demonstrated an understanding of the instructions.   The patient was advised to call back or seek an in-person evaluation if the symptoms worsen or if the condition fails to improve as anticipated.   Leah Gasser, MD

## 2019-08-01 NOTE — Progress Notes (Signed)
She reports that she is in the midst of moving and feels stressed about this. Otherwise she feels that her current regimen is working pretty well for her.

## 2019-08-01 NOTE — Assessment & Plan Note (Signed)
Discussed options.  She would like to start with low Loestrin.  Discussed with her how to start the medication and potential side effects.  Monitor for any sign of blood clot.  Will need to check her blood pressure at some point on the medication.

## 2019-08-08 ENCOUNTER — Ambulatory Visit (HOSPITAL_COMMUNITY): Payer: Federal, State, Local not specified - PPO | Admitting: Licensed Clinical Social Worker

## 2019-08-12 ENCOUNTER — Encounter: Payer: Self-pay | Admitting: Family Medicine

## 2019-08-19 ENCOUNTER — Telehealth: Payer: Self-pay | Admitting: Family Medicine

## 2019-08-19 NOTE — Telephone Encounter (Signed)
PT is requesting a covid test for travel on 10/29/19. Would we schedule her on the nurse schedule?

## 2019-08-19 NOTE — Telephone Encounter (Signed)
Yes- nurse drive through COVID swab for travel. Make sure patient schedules this appropriately and allots enough time for results (at least 3-5 days)

## 2019-08-19 NOTE — Telephone Encounter (Signed)
Let PT know about the information. Nothing has been scheduled

## 2019-08-29 ENCOUNTER — Telehealth (INDEPENDENT_AMBULATORY_CARE_PROVIDER_SITE_OTHER): Payer: Federal, State, Local not specified - PPO | Admitting: Nurse Practitioner

## 2019-08-29 ENCOUNTER — Encounter: Payer: Self-pay | Admitting: Nurse Practitioner

## 2019-08-29 VITALS — Ht 68.0 in | Wt 230.0 lb

## 2019-08-29 DIAGNOSIS — J019 Acute sinusitis, unspecified: Secondary | ICD-10-CM | POA: Diagnosis not present

## 2019-08-29 DIAGNOSIS — B9689 Other specified bacterial agents as the cause of diseases classified elsewhere: Secondary | ICD-10-CM

## 2019-08-29 DIAGNOSIS — Z20822 Contact with and (suspected) exposure to covid-19: Secondary | ICD-10-CM | POA: Diagnosis not present

## 2019-08-29 MED ORDER — BENZONATATE 200 MG PO CAPS: 200.0000 mg | ORAL_CAPSULE | Freq: Three times a day (TID) | ORAL | 0 refills | Status: DC | PRN

## 2019-08-29 MED ORDER — ONDANSETRON HCL 4 MG PO TABS: 4.0000 mg | ORAL_TABLET | Freq: Three times a day (TID) | ORAL | 3 refills | Status: DC | PRN

## 2019-08-29 MED ORDER — ALBUTEROL SULFATE HFA 108 (90 BASE) MCG/ACT IN AERS: 1.0000 | INHALATION_SPRAY | RESPIRATORY_TRACT | 1 refills | Status: DC | PRN

## 2019-08-29 MED ORDER — AZITHROMYCIN 500 MG PO TABS: 500.0000 mg | ORAL_TABLET | Freq: Every day | ORAL | 0 refills | Status: AC

## 2019-08-29 NOTE — Patient Instructions (Signed)
The following information is provided as a general resource for ADULT patients only and does NOT take into account PREGNANCY, ALLERGIES, LIVER CONDITIONS, KIDNEY CONDITIONS, GASTROINTESTINAL CONDITIONS, OR PRESCRIPTION MEDICATION INTERACTIONS. Please be sure to ask your provider if the following are safe to take with your specific medical history, conditions, or current medication regimen if you are unsure.   Adult OTC Symptom Management for COVID-19  Be sure to drink plenty of fluids and allow your body to rest as much as possible.   Congestion: Guaifenesin (Mucinex)- follow directions on packaging with a maximum dose of 2400mg in a 24 hour period.  Pain/Fever: Acetaminophen 500mg -1000mg every 6-8 hours as needed (MAX 3000mg in a 24 hour period) Ibuprofen may be used if Acetaminophen is not helpful-  Ibuprofen 200mg - 400mg every 4-6 hours as needed (MAX 1200mg in a 24 hour period)  Cough: Dextromethorphan (Delsym)- follow directions on packing with a maximum dose of 120mg in a 24 hour period.  Nasal Stuffiness: Saline nasal spray and/or Nettie Pot with sterile saline solution. Oxymetazoline (Afrin) may be used for NO MORE than 3 days  Runny Nose: Fluticasone nasal spray (Flonase) OR Mometasone nasal spray (Nasonex) OR Triamcinolone Acetonide nasal spray (Nasacort)- follow directions on the packaging  Sinus Pain/Pressure: Warm washcloth to the face  Sore Throat: Warm salt water gargles, warm tea, honey, Cepacol or Chloraseptic lozenges   Vics Vapo Rub or other mentholated rubs may help relieve symptoms when used per instructions on the packaging.   **Many medications will have more than one ingredient, be sure you are reading the packaging carefully and not taking more than one dose of the same kind of medication at the same time or too close together. It is OK to use formulas that have all of the ingredients you want, but do not take them in a combined medication and as separate dose too  close together. If you have any questions, the pharmacist will be happy to help you decide what is safe.  IF YOU EXPERIENCE SHORTNESS OF BREATH AT REST, DIZZINESS, OR A CHANGE IN MENTAL STATUS YOU NEED TO SEEK EMERGENCY CARE IMMEDIATELY.   You can register for COVID testing at the [facility] by texting the word "COVID" to [phone]  Or  Visit the [place] [facility] web site and search for FIND MY TESTING PLACE or use the following link: [email or web address]://[email or web address].ncdhhs.gov/about-covid-19/testing/find-my-testing-place       

## 2019-08-29 NOTE — Progress Notes (Signed)
Virtual Visit via MyChart Note  I connected with  Leah Cervantes on 08/29/19 at  1:30 PM EDT by the video enabled telemedicine application, MyChart, and verified that I am speaking with the correct person using two identifiers.   I introduced myself as a Designer, jewellery with the practice. We discussed the limitations of evaluation and management by telemedicine and the availability of in person appointments. The patient expressed understanding and agreed to proceed.  The patient is: in her car I am: in the office  Subjective:    CC: COVID-19 symptoms  HPI: Leah Cervantes is a 19 y.o. y/o female presenting via [place] today for symptoms associated with COVID-19 for the past 2 days. She reports cough, congestion, rhinorrhea, sore throat, loss of taste, loss of smell, low grade fever (T max 99.6), mild shortness of breath, and nausea that started approximately two days ago. She reports purulent mucous production from her sinuses with tenderness and pressure when bending over. She also reports productive green sputum with coughing. Her roommate has similar symptoms and is being tested for COVID-19. She also resides with children who have been running fevers with symptoms, as well.   Past medical history, Surgical history, Family history not pertinant except as noted below, Social history, Allergies, and medications have been entered into the medical record, reviewed, and corrections made.   Review of Systems:  Denies diarrhea, vomiting, chest pain, palpitations, dizziness.  Endorses cough, headache, shortness of breath, rhinorrhea, fever, loss of taste, and loss of smell.   Objective:    General: Speaking clearly in complete sentences without any shortness of breath.  Alert and oriented x3.  Normal judgment. No apparent acute distress. She is audibly congested and exhibits mild periorbital edema.    Impression and Recommendations:    1. Suspected 2019-nCoV infection Symptoms and  presentation consistent with COVID-19. Patient is a current smoker and at an increased risk of developing pneumonia. Low threshold for increasing the antibiotic length of time or strength based on symptoms. Patient provided with instructions to locate free testing sites within [facility] and encouraged to seek testing.  Patient instructed to quarantine until test results are received or until 14 days after symptom onset.  Emergency instructions provided.  Patient provided with information on symptom management. Letter for work provided.  Follow-up if symptoms worsen or fail to improve.   - benzonatate (TESSALON) 200 MG capsule; Take 1 capsule (200 mg total) by mouth 3 (three) times daily as needed for cough.  Dispense: 30 capsule; Refill: 0 - ondansetron (ZOFRAN) 4 MG tablet; Take 1 tablet (4 mg total) by mouth every 8 (eight) hours as needed for nausea or vomiting.  Dispense: 30 tablet; Refill: 3 - albuterol (VENTOLIN HFA) 108 (90 Base) MCG/ACT inhaler; Inhale 1-2 puffs into the lungs every 4 (four) hours as needed for wheezing or shortness of breath.  Dispense: 6.7 g; Refill: 1  2. Acute bacterial sinusitis Symptoms and presentation consistent with sinusitis. Prescription for azithromycin sent to pharmacy if symptoms persist into the weekend. Instructions provided for patient.  - azithromycin (ZITHROMAX) 500 MG tablet; Take 1 tablet (500 mg total) by mouth daily for 3 days.  Dispense: 3 tablet; Refill: 0    I discussed the assessment and treatment plan with the patient. The patient was provided an opportunity to ask questions and all were answered. The patient agreed with the plan and demonstrated an understanding of the instructions.   The patient was advised to call back or seek  an in-person evaluation if the symptoms worsen or if the condition fails to improve as anticipated.  I provided 20 minutes of non-face-to-face interaction with this MYCHART visit.   Tollie Eth, NP

## 2019-08-29 NOTE — Progress Notes (Signed)
Pt states having cough x 2 days. Coughing yellow, green mucus Blowing nose every 5 - 10 minutes. Yellow, green mucus and some blood.   Lost sense of taste almost completely.  Unable to taste cigarettes and pizza with garlic sauce.  Having chills and sweats.  Wants to be tested for COVID-19.

## 2019-08-30 DIAGNOSIS — Z1152 Encounter for screening for COVID-19: Secondary | ICD-10-CM | POA: Diagnosis not present

## 2019-09-23 ENCOUNTER — Other Ambulatory Visit: Payer: Self-pay | Admitting: Family Medicine

## 2019-10-31 DIAGNOSIS — Z20828 Contact with and (suspected) exposure to other viral communicable diseases: Secondary | ICD-10-CM | POA: Diagnosis not present

## 2019-10-31 DIAGNOSIS — Z03818 Encounter for observation for suspected exposure to other biological agents ruled out: Secondary | ICD-10-CM | POA: Diagnosis not present

## 2019-12-07 ENCOUNTER — Other Ambulatory Visit: Payer: Self-pay | Admitting: Family Medicine

## 2020-01-18 IMAGING — DX DG LUMBAR SPINE COMPLETE 4+V
5 series · 5 of 5 positions shown · non-contrast
Comparison: None.

CLINICAL DATA: 18-year-old female with left-sided back pain.

EXAM:
LUMBAR SPINE - COMPLETE 4+ VIEW

[l-spine ap]
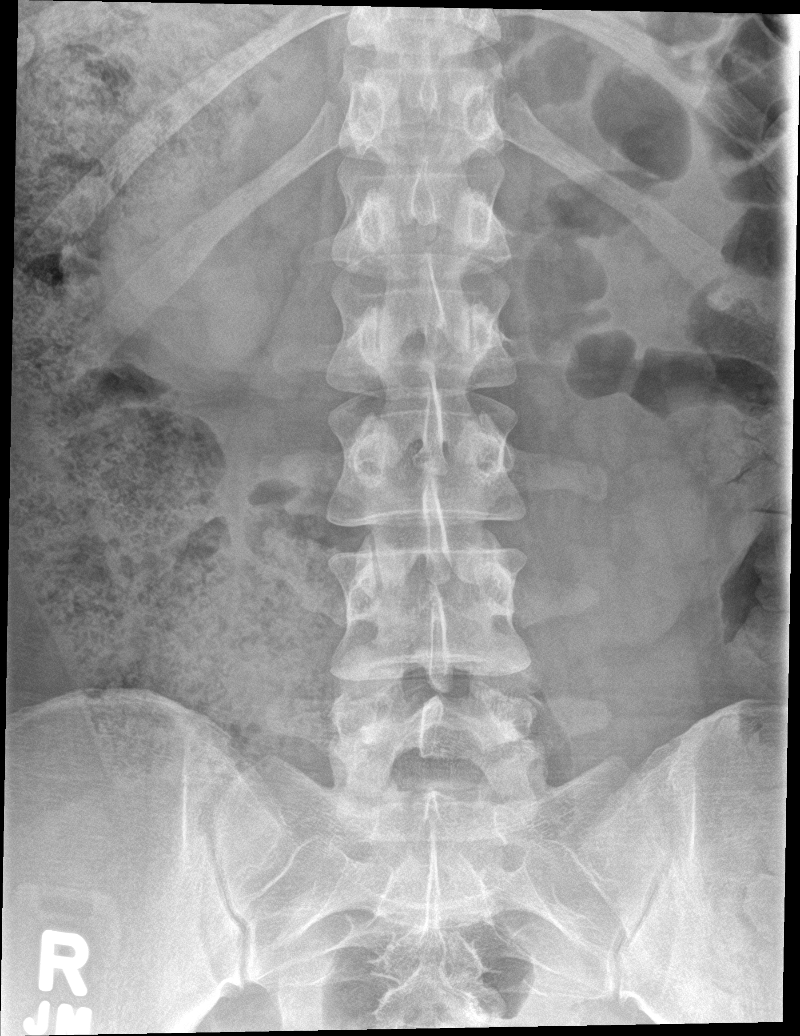

[l-spine obl (1 of 2)]
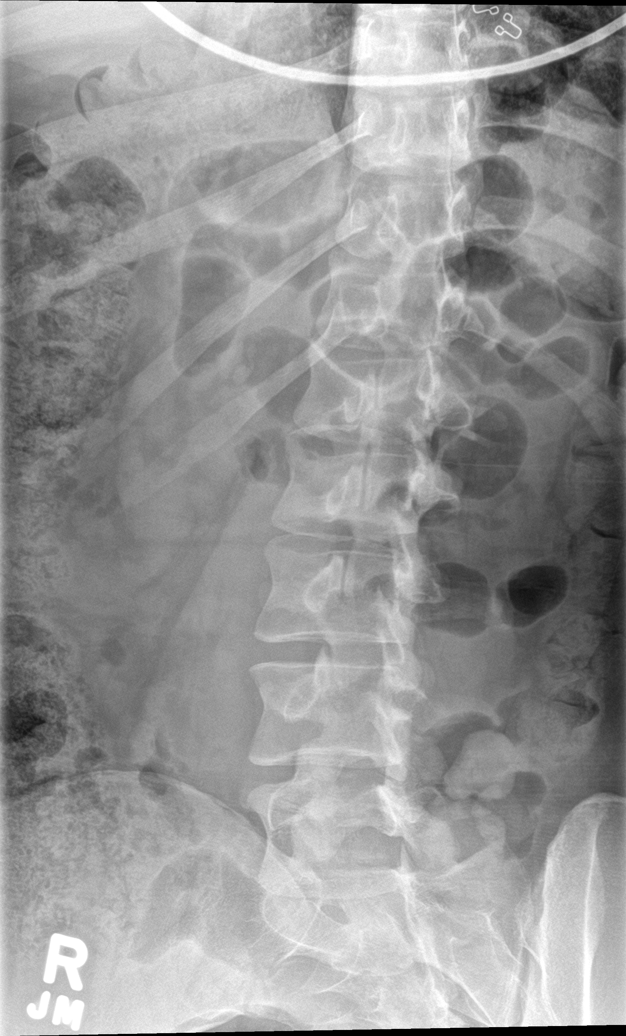

[l-spine obl (2 of 2)]
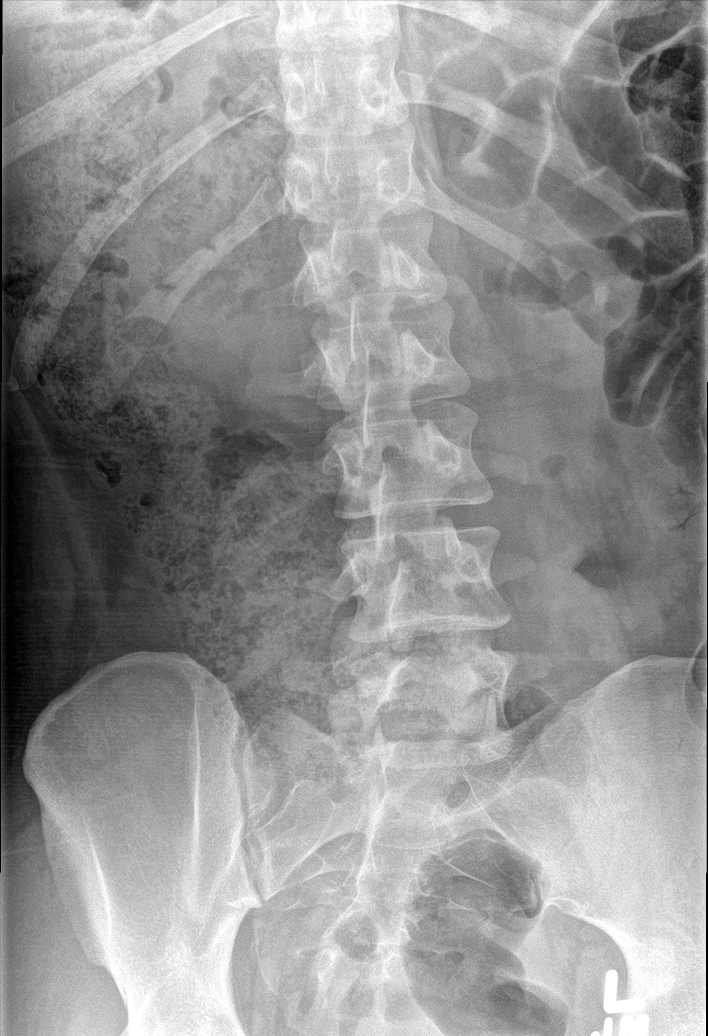

[l-spine lat]
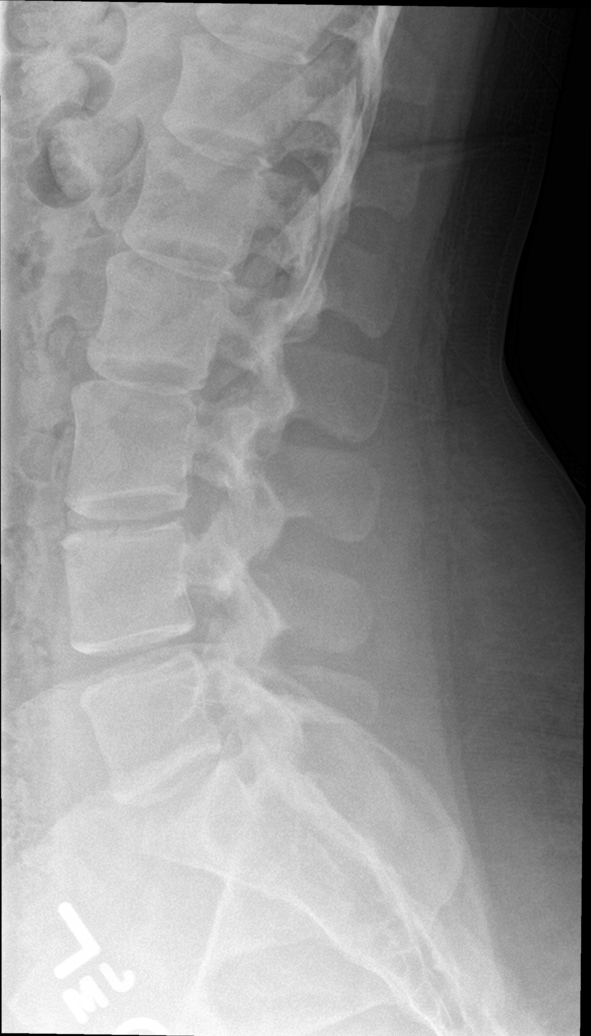

[l-spine spot]
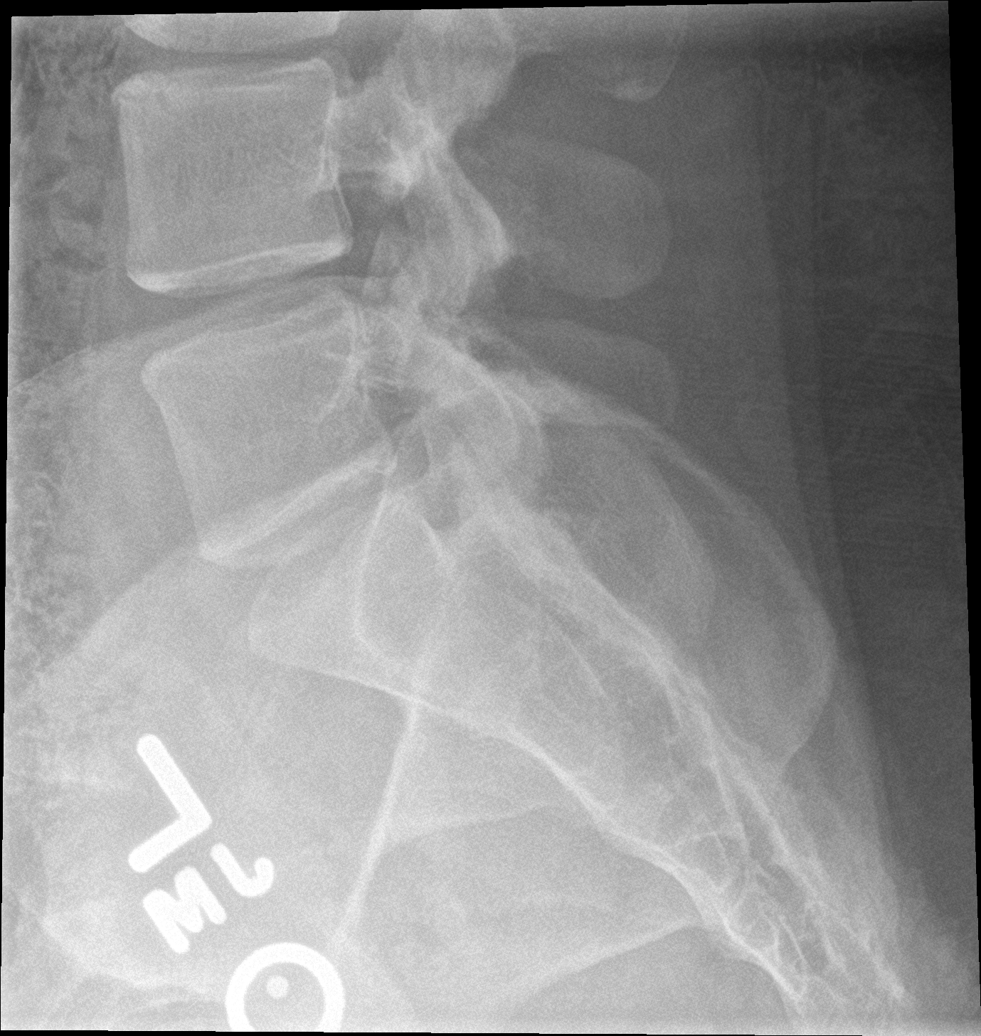

[5 of 5 positions shown; findings below may reference images not displayed]

FINDINGS: Five lumbar type vertebra. There is no acute fracture or subluxation
of the lumbar spine. The vertebral body heights and disc spaces are
maintained. The visualized posterior elements are intact. The neural
foramina are patent as visualized. The soft tissues are
unremarkable. There is moderate stool throughout the colon.
IMPRESSION: No acute/traumatic lumbar spine pathology.

## 2020-02-08 ENCOUNTER — Encounter: Payer: Self-pay | Admitting: Family Medicine

## 2020-02-14 ENCOUNTER — Ambulatory Visit (INDEPENDENT_AMBULATORY_CARE_PROVIDER_SITE_OTHER): Payer: Federal, State, Local not specified - PPO | Admitting: Nurse Practitioner

## 2020-02-14 ENCOUNTER — Other Ambulatory Visit: Payer: Self-pay

## 2020-02-14 ENCOUNTER — Encounter: Payer: Self-pay | Admitting: Nurse Practitioner

## 2020-02-14 VITALS — BP 111/70 | HR 89 | Ht 68.0 in | Wt 234.0 lb

## 2020-02-14 DIAGNOSIS — L255 Unspecified contact dermatitis due to plants, except food: Secondary | ICD-10-CM | POA: Diagnosis not present

## 2020-02-14 MED ORDER — HYDROXYZINE HCL 50 MG PO TABS: 50.0000 mg | ORAL_TABLET | Freq: Every evening | ORAL | 3 refills | Status: DC | PRN

## 2020-02-14 MED ORDER — CLOBETASOL PROPIONATE 0.15 MG/ACT (0.05%) EX LOTN: TOPICAL_LOTION | CUTANEOUS | 1 refills | Status: DC

## 2020-02-14 NOTE — Progress Notes (Signed)
Acute Office Visit  Subjective:    Patient ID: Leah Cervantes, female    DOB: 04/03/2001, 19 y.o.   MRN: [id number]  No chief complaint on file.   HPI Patient is in today for suspected poison oak dermatitis. 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 She believes that while feeding and caring for the kittens she has come in contact with poison oak or poison ivy. She endorses a red rash on her left flank and scattered places across her abdomen. She also has a small area on the left side of her neck and behind her ear. She has been using calamine lotion, but the rash is continuing to spread and she is finding that when she wakes up she has scratched in her sleep.   She denies signs of infection or large areas of involvement on the skin. Denies fever, chills, nausea, vomiting, or diarrhea.   Past Medical History:  Diagnosis Date  . Depression     History reviewed. No pertinent surgical history.  Family History  Problem Relation Age of Onset  . Hearing loss Mother   . Hypertension Mother   . Fibromyalgia Sister   . Stroke Maternal Grandmother   . Heart disease Maternal Grandmother   . Hypertension Maternal Grandmother   . Fibromyalgia Maternal Grandfather   . Raynaud syndrome Paternal Grandmother   . Heart attack Paternal Grandfather   . Bipolar disorder Maternal Uncle     Social History   Socioeconomic History  . Marital status: Single    Spouse name: Not on file  . Number of children: Not on file  . Years of education: Not on file  . Highest education level: Not on file  Occupational History  . Not on file  Tobacco Use  . Smoking status: Current Every Day Smoker    Packs/day: 0.35  . Smokeless tobacco: Never Used  Vaping Use  . Vaping Use: Never used  Substance and Sexual Activity  . Alcohol use: Yes    Comment: occasional/very rare  . Drug use: No  . Sexual activity: Not on file  Other Topics Concern  . Not on file    Social History Narrative  . Not on file   Social Determinants of Health   Financial Resource Strain:   . Difficulty of Paying Living Expenses: Not on file  Food Insecurity:   . Worried About Programme researcher, broadcasting/film/video in the Last Year: Not on file  . Ran Out of Food in the Last Year: Not on file  Transportation Needs:   . Lack of Transportation (Medical): Not on file  . Lack of Transportation (Non-Medical): Not on file  Physical Activity:   . Days of Exercise per Week: Not on file  . Minutes of Exercise per Session: Not on file  Stress:   . Feeling of Stress : Not on file  Social Connections:   . Frequency of Communication with Friends and Family: Not on file  . Frequency of Social Gatherings with Friends and Family: Not on file  . Attends Religious Services: Not on file  . Active Member of Clubs or Organizations: Not on file  . Attends Banker Meetings: Not on file  . Marital Status: Not on file  Intimate Partner Violence:   . Fear of Current or Ex-Partner: Not on file  . Emotionally Abused: Not on file  . Physically Abused: Not on file  . Sexually Abused: Not on file  Outpatient Medications Prior to Visit  Medication Sig Dispense Refill  . DULoxetine (CYMBALTA) 30 MG capsule TAKE 1 CAPSULE (30 MG TOTAL) BY MOUTH 3 (THREE) TIMES DAILY. 90 capsule 1  . albuterol (VENTOLIN HFA) 108 (90 Base) MCG/ACT inhaler Inhale 1-2 puffs into the lungs every 4 (four) hours as needed for wheezing or shortness of breath. 6.7 g 1  . benzonatate (TESSALON) 200 MG capsule Take 1 capsule (200 mg total) by mouth 3 (three) times daily as needed for cough. 30 capsule 0  . Norethindrone-Ethinyl Estradiol-Fe Biphas (LO LOESTRIN FE) 1 MG-10 MCG / 10 MCG tablet Take 1 tablet by mouth daily. 1 Package 11  . ondansetron (ZOFRAN) 4 MG tablet Take 1 tablet (4 mg total) by mouth every 8 (eight) hours as needed for nausea or vomiting. 30 tablet 3   No facility-administered medications prior to visit.     No Known Allergies     Objective:    Physical Exam Vitals and nursing note reviewed.  Constitutional:      Appearance: Normal appearance.  HENT:     Head: Normocephalic.  Eyes:     Extraocular Movements: Extraocular movements intact.     Conjunctiva/sclera: Conjunctivae normal.     Pupils: Pupils are equal, round, and reactive to light.  Cardiovascular:     Rate and Rhythm: Normal rate.  Pulmonary:     Effort: Pulmonary effort is normal.  Musculoskeletal:        General: Normal range of motion.     Cervical back: Normal range of motion and neck supple.  Skin:    General: Skin is warm and dry.     Capillary Refill: Capillary refill takes less than 2 seconds.     Findings: Erythema and rash present. Rash is papular and scaling.          Comments: No signs of infection.   Neurological:     General: No focal deficit present.     Mental Status: She is alert and oriented to person, place, and time.  Psychiatric:        Mood and Affect: Mood normal.        Behavior: Behavior normal.        Thought Content: Thought content normal.        Judgment: Judgment normal.     BP 111/70   Pulse 89   Ht 5\' 8"  (1.727 m)   Wt 234 lb (106.1 kg)   BMI 35.58 kg/m  Wt Readings from Last 3 Encounters:  02/14/20 234 lb (106.1 kg)  08/29/19 230 lb (104.3 kg)  07/08/19 226 lb (102.5 kg)    Health Maintenance Due  Topic Date Due  . Hepatitis C Screening  Never done  . COVID-19 Vaccine (1) Never done  . HIV Screening  Never done  . INFLUENZA VACCINE  01/05/2020    There are no preventive care reminders to display for this patient.   Lab Results  Component Value Date   TSH 2.374 11/10/2018   Lab Results  Component Value Date   WBC 8.0 11/10/2018   HGB 13.5 11/10/2018   HCT 41.4 11/10/2018   MCV 95.2 11/10/2018   PLT 313 11/10/2018   Lab Results  Component Value Date   NA 138 11/10/2018   K 3.7 11/10/2018   CO2 24 11/10/2018   GLUCOSE 92 11/10/2018   BUN 15  11/10/2018   CREATININE 0.86 11/10/2018   BILITOT 0.5 11/10/2018   ALKPHOS 89 11/10/2018   AST 22  11/10/2018   ALT 28 11/10/2018   PROT 7.8 11/10/2018   ALBUMIN 4.3 11/10/2018   CALCIUM 9.0 11/10/2018   ANIONGAP 9 11/10/2018   Lab Results  Component Value Date   CHOL 147 11/10/2018   Lab Results  Component Value Date   HDL 36 (L) 11/10/2018   Lab Results  Component Value Date   LDLCALC 87 11/10/2018   Lab Results  Component Value Date   TRIG 122 11/10/2018   Lab Results  Component Value Date   CHOLHDL 4.1 11/10/2018   Lab Results  Component Value Date   HGBA1C 5.3 11/10/2018       Assessment & Plan:   Problem List Items Addressed This Visit      Musculoskeletal and Integument   Dermatitis due to plants, including poison ivy, sumac, and oak - Primary    Contact dermatitis due to suspected poison ivy or poison oak. Recommend continue utilization of calamine lotion to area on the face and neck.  Clobetasol lotion prescribed for abdominal region.  Hydroxyzine to be taken at bedtime for nighttime itching.  Did suggest that she could try half a dose of hydroxyzine for itching during the day. Monitor for signs of possible infection from scratching. Follow-up if symptoms worsen or fail to improve-we will consider oral steroids if symptoms persist or worsen.      Relevant Medications   Clobetasol Propionate 0.15 MG/ACT (0.05%) LOTN   hydrOXYzine (ATARAX/VISTARIL) 50 MG tablet       Meds ordered this encounter  Medications  . Clobetasol Propionate 0.15 MG/ACT (0.05%) LOTN    Sig: Apply to affected areas twice a day for irritation and itching.    Dispense:  68 g    Refill:  1  . hydrOXYzine (ATARAX/VISTARIL) 50 MG tablet    Sig: Take 1 tablet (50 mg total) by mouth at bedtime and may repeat dose one time if needed. For itching.    Dispense:  60 tablet    Refill:  3     Tollie Eth, NP

## 2020-02-14 NOTE — Patient Instructions (Signed)
Poison Oak Dermatitis  Poison oak dermatitis is inflammation of the skin that is caused by contact with the chemicals in the leaves of the poison oak (Toxicodendron) plant. The skin reaction often includes redness, swelling, blisters, and extreme itching. What are the causes? This condition is caused by a specific chemical (urushiol) that is found in the sap of the poison oak plant. This chemical is sticky and can be easily spread to people, animals, and objects. You can get poison oak dermatitis by:  Having direct contact with a poison oak plant.  Touching animals, other people, or objects that have come in contact with poison oak and have the chemical on them. What increases the risk? This condition is more likely to develop in people who:  Are outdoors often in wooded or marshy areas.  Go outdoors without wearing protective clothing, such as closed shoes, long pants, and a long-sleeved shirt. What are the signs or symptoms? Symptoms of this condition include:  Redness of the skin.  Extreme itching.  A rash that often includes bumps and blisters. The rash usually appears 48 hours after exposure if you have been exposed before. If this is the first time you have been exposed, the rash may not appear until a week after exposure.  Swelling. This may occur if the reaction is more severe. Symptoms usually last for 1-2 weeks. However, the first time you develop this condition, symptoms may last 3-4 weeks. How is this diagnosed? This condition may be diagnosed based on your symptoms and a physical exam. Your health care provider may also ask you about any recent outdoor activity. How is this treated? Treatment for this condition will vary depending on how severe it is. Treatment may include:  Hydrocortisone creams or calamine lotions to relieve itching.  Oatmeal baths to soothe the skin.  Over-the-counter antihistamine medicines to help reduce itching.  Steroid medicine taken by mouth  (orally) for more severe reactions. Follow these instructions at home: Medicines  Take or apply over-the-counter and prescription medicines only as told by your health care provider.  Use hydrocortisone creams or calamine lotion as needed to soothe the skin and relieve itching. General instructions  Do not scratch or rub your skin.  Apply a cold, wet cloth (cold compress) to the affected areas or take baths in cool water. This will help with itching. Avoid hot baths and showers.  Take oatmeal baths as needed. Use colloidal oatmeal. You can get this at your local pharmacy or grocery store. Follow the instructions on the packaging.  While you have the rash, wash clothes right after you wear them.  Keep all follow-up visits as told by your health care provider. This is important. How is this prevented?   Learn to identify the poison oak plant and avoid contact with the plant. This plant can be recognized by the number of leaves. Generally, poison oak has three leaves with flowering branches on a single stem. The leaves are often a bit fuzzy and have a toothlike edge.  If you have been exposed to poison oak, thoroughly wash your skin with soap and water right away. You have about 30 minutes to remove the plant resin before it will cause the rash. Be sure to wash under your fingernails because any plant resin there will continue to spread the rash.  When hiking or camping, wear clothes that will help you avoid exposure on the skin. This includes long pants, a long-sleeved shirt, tall socks, and hiking boots. You can also   apply preventive lotion to your skin to help limit exposure.  If you suspect that your clothes or outdoor gear came in contact with poison oak, rinse them off outside with a garden hose before bringing them inside your house.  When doing yard work or gardening, wear gloves, long sleeves, long pants, and boots. Wash your garden tools and gloves if they come in contact with  poison oak.  If you suspect that your pet has come into contact with poison oak, wash him or her with pet shampoo and water. Make sure you wear gloves while washing your pet.  Do not burn poison oak plants. This can release the chemical from the plant into the air and may cause a reaction on the skin or eyes, or in the lungs from breathing in the smoke. Contact a health care provider if you have:  Open sores in the rash area.  More redness, swelling, or pain in the affected area.  Redness that spreads beyond the rash area.  Fluid, blood, or pus coming from the affected area.  A fever.  A rash over a large area of your body.  A rash on your eyes, mouth, or genitals.  A rash that does not improve after a few weeks. Get help right away if:  Your face swells or your eyes swell shut.  You have trouble breathing.  You have trouble swallowing. These symptoms may represent a serious problem that is an emergency. Do not wait to see if the symptoms will go away. Get medical help right away. Call your local emergency services (911 in the [place] Do not drive yourself to the hospital. Summary  Poison oak dermatitis is inflammation of the skin that is caused by contact with the chemicals on the leaves of the poison oak (Toxicodendron) plant.  Symptoms of this condition include redness, extreme itching, a rash, and swelling.  Do not scratch or rub your skin.  Take or apply over-the-counter and prescription medicines only as told by your health care provider. This information is not intended to replace advice given to you by your health care provider. Make sure you discuss any questions you have with your health care provider. Document Revised: 09/14/2018 Document Reviewed: 06/22/2018 Elsevier Patient Education  2020 [employer]  

## 2020-02-14 NOTE — Assessment & Plan Note (Signed)
Contact dermatitis due to suspected poison ivy or poison oak. Recommend continue utilization of calamine lotion to area on the face and neck.  Clobetasol lotion prescribed for abdominal region.  Hydroxyzine to be taken at bedtime for nighttime itching.  Did suggest that she could try half a dose of hydroxyzine for itching during the day. Monitor for signs of possible infection from scratching. Follow-up if symptoms worsen or fail to improve-we will consider oral steroids if symptoms persist or worsen.

## 2020-02-19 ENCOUNTER — Other Ambulatory Visit: Payer: Self-pay | Admitting: Family Medicine

## 2020-03-12 ENCOUNTER — Other Ambulatory Visit: Payer: Self-pay | Admitting: Family Medicine

## 2020-03-14 ENCOUNTER — Other Ambulatory Visit: Payer: Self-pay | Admitting: Osteopathic Medicine

## 2020-03-26 ENCOUNTER — Encounter: Payer: Self-pay | Admitting: Family Medicine

## 2020-03-26 ENCOUNTER — Other Ambulatory Visit: Payer: Self-pay

## 2020-03-26 ENCOUNTER — Ambulatory Visit (INDEPENDENT_AMBULATORY_CARE_PROVIDER_SITE_OTHER): Payer: Federal, State, Local not specified - PPO | Admitting: Family Medicine

## 2020-03-26 DIAGNOSIS — M5416 Radiculopathy, lumbar region: Secondary | ICD-10-CM | POA: Insufficient documentation

## 2020-03-26 MED ORDER — CYCLOBENZAPRINE HCL 10 MG PO TABS: 10.0000 mg | ORAL_TABLET | Freq: Three times a day (TID) | ORAL | 0 refills | Status: DC | PRN

## 2020-03-26 MED ORDER — NAPROXEN 500 MG PO TABS: 500.0000 mg | ORAL_TABLET | Freq: Two times a day (BID) | ORAL | 0 refills | Status: DC

## 2020-03-26 NOTE — Assessment & Plan Note (Signed)
Previous adverse reaction to prednisone.  Will treat with naproxen and flexeril.  Given handout for home stretches.  Discussed if not improving would recommend referral for home PT

## 2020-03-26 NOTE — Patient Instructions (Signed)

## 2020-03-26 NOTE — Progress Notes (Signed)
Leah Cervantes - 19 y.o. female MRN [id number]  Date of birth: [birth year]  Subjective Chief Complaint  Patient presents with  . Back Pain    HPI Leah Cervantes is a 19 y.o. female here today with complaint of low back pain.  Symptoms started yesterday.  Located on L side of lower back and has radiation into the L leg.  She was at work when this started but denies any known injury or overuse.  She denies weakness of the leg, bowel or bladder incontinence, fever or chills.  She has not tried anything at home.  She reports when she had this last time she had broke out into hives after taking prednisone.    ROS:  A comprehensive ROS was completed and negative except as noted per HPI  No Known Allergies  Past Medical History:  Diagnosis Date  . Depression     History reviewed. No pertinent surgical history.  Social History   Socioeconomic History  . Marital status: Single    Spouse name: Not on file  . Number of children: Not on file  . Years of education: Not on file  . Highest education level: Not on file  Occupational History  . Not on file  Tobacco Use  . Smoking status: Current Every Day Smoker    Packs/day: 0.35  . Smokeless tobacco: Never Used  Vaping Use  . Vaping Use: Never used  Substance and Sexual Activity  . Alcohol use: Yes    Comment: occasional/very rare  . Drug use: No  . Sexual activity: Not on file  Other Topics Concern  . Not on file  Social History Narrative  . Not on file   Social Determinants of Health   Financial Resource Strain:   . Difficulty of Paying Living Expenses: Not on file  Food Insecurity:   . Worried About Programme researcher, broadcasting/film/video in the Last Year: Not on file  . Ran Out of Food in the Last Year: Not on file  Transportation Needs:   . Lack of Transportation (Medical): Not on file  . Lack of Transportation (Non-Medical): Not on file  Physical Activity:   . Days of Exercise per Week: Not on file  . Minutes of Exercise per  Session: Not on file  Stress:   . Feeling of Stress : Not on file  Social Connections:   . Frequency of Communication with Friends and Family: Not on file  . Frequency of Social Gatherings with Friends and Family: Not on file  . Attends Religious Services: Not on file  . Active Member of Clubs or Organizations: Not on file  . Attends Banker Meetings: Not on file  . Marital Status: Not on file    Family History  Problem Relation Age of Onset  . Hearing loss Mother   . Hypertension Mother   . Fibromyalgia Sister   . Stroke Maternal Grandmother   . Heart disease Maternal Grandmother   . Hypertension Maternal Grandmother   . Fibromyalgia Maternal Grandfather   . Raynaud syndrome Paternal Grandmother   . Heart attack Paternal Grandfather   . Bipolar disorder Maternal Uncle     Health Maintenance  Topic Date Due  . Hepatitis C Screening  Never done  . COVID-19 Vaccine (1) Never done  . HIV Screening  Never done  . INFLUENZA VACCINE  01/05/2020  . TETANUS/TDAP  02/22/2021     ----------------------------------------------------------------------------------------------------------------------------------------------------------------------------------------------------------------- Physical Exam BP 124/80 (BP Location: Left Arm, Patient Position: Sitting, Cuff  Size: Large)   Pulse (!) 111   Wt 230 lb 3.2 oz (104.4 kg)   SpO2 97%   BMI 35.00 kg/m   Physical Exam Constitutional:      Appearance: Normal appearance.  Eyes:     General: No scleral icterus. Musculoskeletal:     Comments: ROM of L spine is pretty good but with pain.  SLR with pain.  Strength and sensation is normal.    Skin:    General: Skin is warm and dry.  Neurological:     General: No focal deficit present.     Mental Status: She is alert and oriented to person, place, and time.  Psychiatric:        Mood and Affect: Mood normal.        Behavior: Behavior normal.      ------------------------------------------------------------------------------------------------------------------------------------------------------------------------------------------------------------------- Assessment and Plan  Lumbar radiculopathy Previous adverse reaction to prednisone.  Will treat with naproxen and flexeril.  Given handout for home stretches.  Discussed if not improving would recommend referral for home PT   Meds ordered this encounter  Medications  . DISCONTD: naproxen (NAPROSYN) 500 MG tablet    Sig: Take 1 tablet (500 mg total) by mouth 2 (two) times daily with a meal.    Dispense:  30 tablet    Refill:  0  . DISCONTD: cyclobenzaprine (FLEXERIL) 10 MG tablet    Sig: Take 1 tablet (10 mg total) by mouth 3 (three) times daily as needed for muscle spasms.    Dispense:  30 tablet    Refill:  0  . cyclobenzaprine (FLEXERIL) 10 MG tablet    Sig: Take 1 tablet (10 mg total) by mouth 3 (three) times daily as needed for muscle spasms.    Dispense:  30 tablet    Refill:  0  . naproxen (NAPROSYN) 500 MG tablet    Sig: Take 1 tablet (500 mg total) by mouth 2 (two) times daily with a meal.    Dispense:  30 tablet    Refill:  0    No follow-ups on file.    This visit occurred during the SARS-CoV-2 public health emergency.  Safety protocols were in place, including screening questions prior to the visit, additional usage of staff PPE, and extensive cleaning of exam room while observing appropriate contact time as indicated for disinfecting solutions.

## 2020-03-30 ENCOUNTER — Encounter: Payer: Self-pay | Admitting: Family Medicine

## 2020-05-05 ENCOUNTER — Other Ambulatory Visit: Payer: Self-pay | Admitting: Family Medicine

## 2020-05-06 ENCOUNTER — Encounter: Payer: Self-pay | Admitting: Family Medicine

## 2020-05-14 ENCOUNTER — Telehealth (INDEPENDENT_AMBULATORY_CARE_PROVIDER_SITE_OTHER): Payer: Federal, State, Local not specified - PPO | Admitting: Family Medicine

## 2020-05-14 ENCOUNTER — Encounter: Payer: Self-pay | Admitting: Family Medicine

## 2020-05-14 DIAGNOSIS — F332 Major depressive disorder, recurrent severe without psychotic features: Secondary | ICD-10-CM | POA: Diagnosis not present

## 2020-05-14 MED ORDER — DULOXETINE HCL 60 MG PO CPEP
60.0000 mg | ORAL_CAPSULE | Freq: Two times a day (BID) | ORAL | 0 refills | Status: DC
Start: 2020-05-14 — End: 2020-06-30

## 2020-05-14 NOTE — Progress Notes (Unsigned)
Virtual Visit via Telephone Note  I connected with Leah Cervantes on 05/15/20 at  2:20 PM EST by telephone and verified that I am speaking with the correct person using two identifiers.   I discussed the limitations, risks, security and privacy concerns of performing an evaluation and management service by telephone and the availability of in person appointments. I also discussed with the patient that there may be a patient responsible charge related to this service. The patient expressed understanding and agreed to proceed.  Patient location: at home  Provider loccation: In office   Subjective:    CC: F/U depression.   HPI:  Leah Cervantes is doing a virtual visit today to follow-up for her mood.  She is concerned because she has been feeling more down lately.  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  She just feels like her mood has been really down as well.  She has a new boyfriend who is actually supportive and she is very positive about.  Reports that she feels down nearly every day as well as some irritability and poor appetite and difficulty concentrating.  She has had thoughts of not being here.  PHQ-9 score of 19 she is currently on Cymbalta which has been working well for her.  She just would like to try increasing her dose to see if that helps.   Past medical history, Surgical history, Family history not pertinant except as noted below, Social history, Allergies, and medications have been entered into the medical record, reviewed, and corrections made.   Review of Systems: No fevers, chills, night sweats, weight loss, chest pain, or shortness of breath.   Objective:    General: Speaking clearly in complete sentences without any shortness of breath.  Alert and oriented x3.  Normal judgment. No apparent acute  distress.    Impression and Recommendations:    MDD (major depressive disorder), recurrent episode, severe (HCC) Gust options it does sound like she has good support.  I really feel like a lot of this is due to acute stressors with work.  We discussed that I am happy to increase her regimen but I do not know that it is really going to improve her symptoms I think a lot of this is situational and that she is not really getting a lot of rest and downtime and those are things that she will need to work on I do not know if that something she can discuss with her boss or if the overtime is mandatory.  It will be just for about another 3 weeks and then things will slow down again and her hours will drop down so just encouraged her to hang in there.  Also encouraged her to reach out if she needs anything or she is not feeling better or if she is starting to feel like she wants to harm herself.         I discussed the assessment and treatment plan with the patient. The patient was provided an opportunity to ask questions and all were answered. The patient agreed with the plan and demonstrated an understanding of the instructions.   The patient was advised to call back or seek an in-person evaluation if the symptoms worsen or if the condition fails to improve as anticipated.  I  provided 20 minutes of non-face-to-face time during this encounter.   [name], MD

## 2020-05-14 NOTE — Progress Notes (Signed)
LVM advising pt that I was calling to do her prescreening ?

## 2020-05-15 ENCOUNTER — Encounter: Payer: Self-pay | Admitting: Family Medicine

## 2020-05-15 NOTE — Assessment & Plan Note (Signed)
Gust options it does sound like she has good support.  I really feel like a lot of this is due to acute stressors with work.  We discussed that I am happy to increase her regimen but I do not know that it is really going to improve her symptoms I think a lot of this is situational and that she is not really getting a lot of rest and downtime and those are things that she will need to work on I do not know if that something she can discuss with her boss or if the overtime is mandatory.  It will be just for about another 3 weeks and then things will slow down again and her hours will drop down so just encouraged her to hang in there.  Also encouraged her to reach out if she needs anything or she is not feeling better or if she is starting to feel like she wants to harm herself.

## 2020-05-19 NOTE — Telephone Encounter (Signed)
Ok for work note? 

## 2020-05-20 NOTE — Telephone Encounter (Signed)
Note printed and picked up by patient.

## 2020-05-22 ENCOUNTER — Encounter: Payer: Self-pay | Admitting: Family Medicine

## 2020-05-26 ENCOUNTER — Inpatient Hospital Stay (HOSPITAL_COMMUNITY)
Admission: AD | Admit: 2020-05-26 | Discharge: 2020-05-26 | Disposition: A | Discharge status: Home / Self Care | Payer: Federal, State, Local not specified - PPO | Attending: Family Medicine | Admitting: Family Medicine

## 2020-05-26 ENCOUNTER — Other Ambulatory Visit: Payer: Self-pay

## 2020-05-26 DIAGNOSIS — O3680X0 Pregnancy with inconclusive fetal viability, not applicable or unspecified: Secondary | ICD-10-CM | POA: Diagnosis not present

## 2020-05-26 DIAGNOSIS — O210 Mild hyperemesis gravidarum: Secondary | ICD-10-CM | POA: Diagnosis not present

## 2020-05-26 DIAGNOSIS — F329 Major depressive disorder, single episode, unspecified: Secondary | ICD-10-CM | POA: Diagnosis not present

## 2020-05-26 DIAGNOSIS — O99340 Other mental disorders complicating pregnancy, unspecified trimester: Secondary | ICD-10-CM

## 2020-05-26 DIAGNOSIS — F32A Depression, unspecified: Secondary | ICD-10-CM

## 2020-05-26 LAB — URINALYSIS, ROUTINE W REFLEX MICROSCOPIC
Bilirubin Urine: NEGATIVE
Glucose, UA: NEGATIVE mg/dL
Hgb urine dipstick: NEGATIVE
Ketones, ur: NEGATIVE mg/dL
Leukocytes,Ua: NEGATIVE
Nitrite: NEGATIVE
Protein, ur: NEGATIVE mg/dL
Specific Gravity, Urine: 1.027 (ref 1.005–1.030)
pH: 5 (ref 5.0–8.0)

## 2020-05-26 LAB — CBC
HCT: 37.7 % (ref 36.0–46.0)
Hemoglobin: 12.6 g/dL (ref 12.0–15.0)
MCH: 30.7 pg (ref 26.0–34.0)
MCHC: 33.4 g/dL (ref 30.0–36.0)
MCV: 91.7 fL (ref 80.0–100.0)
Platelets: 312 10*3/uL (ref 150–400)
RBC: 4.11 MIL/uL (ref 3.87–5.11)
RDW: 11.7 % (ref 11.5–15.5)
WBC: 8.3 10*3/uL (ref 4.0–10.5)
nRBC: 0 % (ref 0.0–0.2)

## 2020-05-26 LAB — ABO/RH: ABO/RH(D): O POS

## 2020-05-26 LAB — HCG, QUANTITATIVE, PREGNANCY: hCG, Beta Chain, Quant, S: 557 m[IU]/mL — ABNORMAL HIGH (ref <5)

## 2020-05-26 LAB — POCT PREGNANCY, URINE: Preg Test, Ur: POSITIVE — AB

## 2020-05-26 MED ORDER — DOXYLAMINE-PYRIDOXINE 10-10 MG PO TBEC: 2.0000 | DELAYED_RELEASE_TABLET | Freq: Every evening | ORAL | 2 refills | Status: DC

## 2020-05-26 NOTE — MAU Provider Note (Signed)
Event Date/Time   First Provider Initiated Contact with Patient 05/26/20 2044      S Ms. Leah Cervantes is a 19 y.o.  patient who presents to MAU today with complaint of nausea. She states she has been experiencing it for about 3-4 weeks.  She states she has not taken any medications and finds some relief with eating. She states she started having vomiting about 2 weeks ago.  She states she thought it was due to her medication. 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   LMP: April 26, 2020 by Unsure LMP   O BP 134/81 (BP Location: Right Arm)   Pulse 82   Temp 99.3 F (37.4 C) (Oral)   Resp 20   Ht 5\' 8"  (1.727 m)   Wt 105.6 kg   LMP 04/26/2020   BMI 35.38 kg/m  Physical Exam Vitals reviewed.  Constitutional:      Appearance: Normal appearance. She is well-developed.  HENT:     Head: Normocephalic and atraumatic.  Eyes:     Conjunctiva/sclera: Conjunctivae normal.  Cardiovascular:     Rate and Rhythm: Normal rate and regular rhythm.     Heart sounds: Normal heart sounds.  Pulmonary:     Effort: Pulmonary effort is normal. No respiratory distress.     Breath sounds: Normal breath sounds.  Abdominal:     General: Bowel sounds are normal.     Palpations: Abdomen is soft.  Musculoskeletal:        General: Normal range of motion.     Cervical back: Normal range of motion.  Skin:    General: Skin is warm and dry.  Neurological:     Mental Status: She is alert and oriented to person, place, and time.  Psychiatric:        Mood and Affect: Mood normal.        Behavior: Behavior normal.        Thought Content: Thought content normal.     A Medical screening exam complete H/O Depression Nausea Unsure LMP  P -Dr. 04/28/2020 consulted and informed of patient status. Advised that patient should continue Cymbalta at current dosage. -Message to be sent to primary physician regarding MD recommendation.   -Patient informed of recommendation and states her regular dosage is 90mg . Instructed to continue. -Discussed usage of diclegis for nausea and vomiting. Patient states she works at night and was instructed to take in AM prior to going to bed. -Will collect ABO/RH, CBC, and hCG. -Informed that provider will contact her, via mychart, regarding results and follow up. -Plan to follow up on Thursday Dec 23 after 9pm for repeat quant. -Discussed that if appropriate growth noted, would plan for ultrasound in about 2-4 weeks as appropriate. -Patient requests and given OOW excuse for today and for need to return on Thursday.  -Patient and SO without questions or concerns.  -Bleeding Precautions discussed -Discharge from MAU in stable condition.   Dec 25, CNM 05/26/2020 8:44 PM

## 2020-05-26 NOTE — Discharge Instructions (Signed)

## 2020-05-26 NOTE — MAU Note (Signed)
PT SAYS SHE HAS  BEEN  VOMITING X2 WEEKS.  - HAD VISIT- 05-14-2020 WITH DR Darra Lis .( HAD VISIT BC THOUGHT HAD CHEMICAL WITHDRAWALS )  DR METHANEY - PUT ON 120MG  DELUXOTINE - DEPRESSION . AT VISIT- PT WAS NAUSEATED.      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 SHE GETS HOT AT WORK- MAKES NAUSEA AND LIGHT HEADED- FLUSHED .   SHE MESSAGED DR METHANEY ON MY CHART - ON 12-17- SHE REPLIED YESTERDAY - TOLD TO DECREASE MED- CYMBALTA .    POSITIVE UPT ON 12-17. -WAS SUPPOSE TO HAVE AN APPOINTMENT  TODAY WITH [facility] -  TRIED AN APPOINTMENT AT CLINIC - WILL BE FEB .

## 2020-05-28 ENCOUNTER — Inpatient Hospital Stay (HOSPITAL_COMMUNITY)
Admission: AD | Admit: 2020-05-28 | Discharge: 2020-05-28 | Disposition: A | Discharge status: Home / Self Care | Payer: Federal, State, Local not specified - PPO | Attending: Obstetrics and Gynecology | Admitting: Obstetrics and Gynecology

## 2020-05-28 ENCOUNTER — Encounter (HOSPITAL_COMMUNITY): Payer: Self-pay | Admitting: *Deleted

## 2020-05-28 DIAGNOSIS — O3680X0 Pregnancy with inconclusive fetal viability, not applicable or unspecified: Secondary | ICD-10-CM | POA: Diagnosis not present

## 2020-05-28 DIAGNOSIS — Z3A01 Less than 8 weeks gestation of pregnancy: Secondary | ICD-10-CM | POA: Insufficient documentation

## 2020-05-28 LAB — HCG, QUANTITATIVE, PREGNANCY: hCG, Beta Chain, Quant, S: 1275 m[IU]/mL — ABNORMAL HIGH (ref <5)

## 2020-05-28 NOTE — MAU Provider Note (Signed)
S: 19 y.o. G1P0 @[redacted]w[redacted]d  by LMP presents to MAU for repeat hcg.  She denies abdominal pain or vaginal bleeding today.    Her quant hcg on 05/26/20 was 557.  HPI  O: BP 119/66   Pulse 89   Temp 98.8 F (37.1 C)   Resp 18   Ht 5\' 8"  (1.727 m)   LMP 05/05/2020 (Approximate)   BMI 35.38 kg/m   VS reviewed, nursing note reviewed,  Constitutional: well developed, well nourished, no distress HEENT: normocephalic CV: normal rate Pulm/chest wall: normal effort Abdomen: soft Neuro: alert and oriented x 3 Skin: warm, dry Psych: affect normal  Results for orders placed or performed during the hospital encounter of 05/28/20 (from the past 24 hour(s))  hCG, quantitative, pregnancy     Status: Abnormal   Collection Time: 05/28/20  9:40 PM  Result Value Ref Range   hCG, Beta Chain, Quant, S 1,275 (H) <5 mIU/mL    --/--/O POS (12/21 2108)  MDM: Ordered labs/reviewed results.  Quant hcg rose appropriately. Discussed results with pt.  Pt to follow up with ultrasound in 2 weeks.  Ectopic precautions given and pt to return to MAU sooner if s/sx of ectopic, as ruptured ectopic can be life threatening.  Pt stable at time of discharge.  A: 1. Pregnancy of unknown anatomic location     P: D/C home with ectopic/bleeding precautions F/U with outpatient ultrasound as ordered Return to MAU as needed for emergencies  10-02-1998, CNM 05/28/20, 10:58 PM

## 2020-05-28 NOTE — MAU Note (Signed)
Steward Drone CNM in Triage to discuss lab results and plan of care. Pt then dc home from triage

## 2020-05-28 NOTE — MAU Note (Signed)
Pt here for repeat BHCG. Denies any pain or VB. Slight nausea. Labs already drawn. Understands to wait for results and then provider will review results and discuss plan of care. Pt to lobby to wait for results

## 2020-05-28 NOTE — Progress Notes (Signed)
Written and verbal d/c instructions given and understanding voiced. 

## 2020-06-01 ENCOUNTER — Encounter: Payer: Self-pay | Admitting: Family Medicine

## 2020-06-03 MED ORDER — DOXYLAMINE SUCCINATE (SLEEP) 25 MG PO TABS: 25.0000 mg | ORAL_TABLET | Freq: Every evening | ORAL | 1 refills | Status: DC | PRN

## 2020-06-03 MED ORDER — PYRIDOXINE HCL 25 MG PO TABS: 25.0000 mg | ORAL_TABLET | Freq: Every day | ORAL | 1 refills | Status: DC

## 2020-06-11 ENCOUNTER — Ambulatory Visit
Admission: RE | Admit: 2020-06-11 | Discharge: 2020-06-11 | Disposition: A | Discharge status: Home / Self Care | Payer: Federal, State, Local not specified - PPO | Source: Ambulatory Visit | Attending: Certified Nurse Midwife | Admitting: Certified Nurse Midwife

## 2020-06-11 ENCOUNTER — Other Ambulatory Visit: Payer: Self-pay

## 2020-06-11 DIAGNOSIS — O3680X0 Pregnancy with inconclusive fetal viability, not applicable or unspecified: Secondary | ICD-10-CM | POA: Diagnosis not present

## 2020-06-11 DIAGNOSIS — Z3A01 Less than 8 weeks gestation of pregnancy: Secondary | ICD-10-CM | POA: Diagnosis not present

## 2020-06-16 ENCOUNTER — Telehealth: Payer: Self-pay

## 2020-06-16 NOTE — Telephone Encounter (Signed)
error 

## 2020-06-23 ENCOUNTER — Other Ambulatory Visit: Payer: Self-pay

## 2020-06-23 ENCOUNTER — Ambulatory Visit (INDEPENDENT_AMBULATORY_CARE_PROVIDER_SITE_OTHER): Payer: Federal, State, Local not specified - PPO | Admitting: Advanced Practice Midwife

## 2020-06-23 ENCOUNTER — Other Ambulatory Visit (HOSPITAL_COMMUNITY)
Admission: RE | Admit: 2020-06-23 | Discharge: 2020-06-23 | Disposition: A | Discharge status: Home / Self Care | Payer: Federal, State, Local not specified - PPO | Source: Ambulatory Visit | Attending: Advanced Practice Midwife | Admitting: Advanced Practice Midwife

## 2020-06-23 ENCOUNTER — Encounter: Payer: Self-pay | Admitting: Advanced Practice Midwife

## 2020-06-23 VITALS — BP 128/77 | HR 84 | Wt 235.0 lb

## 2020-06-23 DIAGNOSIS — N644 Mastodynia: Secondary | ICD-10-CM | POA: Diagnosis not present

## 2020-06-23 DIAGNOSIS — Z3A08 8 weeks gestation of pregnancy: Secondary | ICD-10-CM

## 2020-06-23 DIAGNOSIS — Z3480 Encounter for supervision of other normal pregnancy, unspecified trimester: Secondary | ICD-10-CM | POA: Insufficient documentation

## 2020-06-23 DIAGNOSIS — Z3401 Encounter for supervision of normal first pregnancy, first trimester: Secondary | ICD-10-CM

## 2020-06-23 DIAGNOSIS — O219 Vomiting of pregnancy, unspecified: Secondary | ICD-10-CM

## 2020-06-23 MED ORDER — PRENATAL VITAMINS 28-0.8 MG PO TABS: 1.0000 | ORAL_TABLET | Freq: Every day | ORAL | 5 refills | Status: DC

## 2020-06-23 NOTE — Progress Notes (Signed)
Bedside U/S shows single IUP with FHT of 169 BPM and CRL measures18.99mm  GA [redacted]w[redacted]d.  There appears to be a fibroid measuring 2.95cmx3.46cm.  No active vaginal bleeding noted

## 2020-06-23 NOTE — Progress Notes (Signed)
Subjective:   Leah Cervantes is a 20 y.o. G1P0 at [redacted]w[redacted]d by LMP c/w 8 week [place] being seen today for her first obstetrical visit.  Her obstetrical history is significant for obesity and has ELBOW PAIN, RIGHT; HEADACHE; KNEE PAIN; Raynaud's disease without gangrene; Major depression, recurrent, chronic (HCC); GAD (generalized anxiety disorder); Paresthesias; Vestibular migraine; MDD (major depressive disorder), recurrent episode, severe (HCC); Encounter for initial prescription of contraceptive pills; and Lumbar radiculopathy on their problem list.. Patient does intend to breast feed. Pregnancy history fully reviewed.  Patient reports nausea, vomiting and breast pain.  HISTORY: OB History  Gravida Para Term Preterm AB Living  1 0 0 0 0 0  SAB IAB Ectopic Multiple Live Births  0 0 0 0 0    # Outcome Date GA Lbr Len/2nd Weight Sex Delivery Anes PTL Lv  1 Current            Past Medical History:  Diagnosis Date  . Depression    History reviewed. No pertinent surgical history. Family History  Problem Relation Age of Onset  . Hearing loss Mother   . Hypertension Mother   . Fibromyalgia Sister   . Stroke Maternal Grandmother   . Heart disease Maternal Grandmother   . Hypertension Maternal Grandmother   . Fibromyalgia Maternal Grandfather   . Raynaud syndrome Paternal Grandmother   . Heart attack Paternal Grandfather   . Bipolar disorder Maternal Uncle    Social History   Tobacco Use  . Smoking status: Current Every Day Smoker    Packs/day: 0.35  . Smokeless tobacco: Never Used  Vaping Use  . Vaping Use: Never used  Substance Use Topics  . Alcohol use: Yes    Comment: occasional/very rare  . Drug use: No   Allergies  Allergen Reactions  . Prednisone Hives   Current Outpatient Medications on File Prior to Visit  Medication Sig Dispense Refill  . doxylamine, Sleep, (UNISOM) 25 MG tablet Take 1 tablet (25 mg total) by mouth at bedtime as needed. 30 tablet 1  .  DULoxetine (CYMBALTA) 60 MG capsule Take 1 capsule (60 mg total) by mouth 2 (two) times daily. 60 capsule 0  . pyridOXINE (VITAMIN B-6) 25 MG tablet Take 1 tablet (25 mg total) by mouth at bedtime. 30 tablet 1  . Doxylamine-Pyridoxine 10-10 MG TBEC Take 2 tablets by mouth at bedtime. 60 tablet 2   No current facility-administered medications on file prior to visit.     Indications for ASA therapy (per uptodate) One of the following: Previous pregnancy with preeclampsia, especially early onset and with an adverse outcome No Multifetal gestation No Chronic hypertension No Type 1 or 2 diabetes mellitus No Chronic kidney disease No Autoimmune disease (antiphospholipid syndrome, systemic lupus erythematosus) No   Two or more of the following: Nulliparity Yes Obesity (body mass index >30 kg/m2) Yes Family history of preeclampsia in mother or sister No Age ?35 years No Sociodemographic characteristics (African American race, low socioeconomic level) No Personal risk factors (eg, previous pregnancy with low birth weight or small for gestational age infant, previous adverse pregnancy outcome [eg, stillbirth], interval >10 years between pregnancies) No   Indications for early 1 hour GTT (per uptodate)  BMI >25 (>23 in Asian women) AND one of the following  Gestational diabetes mellitus in a previous pregnancy No Glycated hemoglobin ?5.7 percent (39 mmol/mol), impaired glucose tolerance, or impaired fasting glucose on previous testing No First-degree relative with diabetes No High-risk race/ethnicity (eg,  African American, Latino, Native American, [place] American, [place] Islander) No History of cardiovascular disease No Hypertension or on therapy for hypertension No High-density lipoprotein cholesterol level <35 mg/dL (3.00 mmol/L) and/or a triglyceride level >250 mg/dL (9.23 mmol/L) No Polycystic ovary syndrome No Physical inactivity No Other clinical condition associated with insulin  resistance (eg, severe obesity, acanthosis nigricans) No Previous birth of an infant weighing ?4000 g No Previous stillbirth of unknown cause No Exam   Vitals:   06/23/20 1337  BP: 128/77  Pulse: 84  Weight: 235 lb (106.6 kg)      Uterus:     Pelvic Exam: Perineum: no hemorrhoids, normal perineum   Vulva: normal external genitalia, no lesions   Vagina:  normal mucosa, normal discharge   Cervix: no lesions and normal, pap smear done.    Adnexa: normal adnexa and no mass, fullness, tenderness   Bony Pelvis: average  System: General: well-developed, well-nourished female in no acute distress   Breast:  normal appearance, no masses or tenderness   Skin: normal coloration and turgor, no rashes   Neurologic: oriented, normal, negative, normal mood   Extremities: normal strength, tone, and muscle mass, ROM of all joints is normal   HEENT PERRLA, extraocular movement intact and sclera clear, anicteric   Mouth/Teeth mucous membranes moist, pharynx normal without lesions and dental hygiene good   Neck supple and no masses   Cardiovascular: regular rate and rhythm   Respiratory:  no respiratory distress, normal breath sounds   Abdomen: soft, non-tender; bowel sounds normal; no masses,  no organomegaly     Assessment:   Pregnancy: G1P0 Patient Active Problem List   Diagnosis Date Noted  . Lumbar radiculopathy 03/26/2020  . Encounter for initial prescription of contraceptive pills 08/01/2019  . MDD (major depressive disorder), recurrent episode, severe (HCC) 11/09/2018  . Paresthesias 12/06/2017  . Vestibular migraine 12/06/2017  . Major depression, recurrent, chronic (HCC) 08/26/2016  . GAD (generalized anxiety disorder) 08/26/2016  . Raynaud's disease without gangrene 06/13/2016  . KNEE PAIN 09/24/2010  . HEADACHE 07/05/2010  . ELBOW PAIN, RIGHT 03/22/2010     Plan:  1. Supervision of other normal pregnancy, antepartum --Anticipatory guidance about next visits/weeks of  pregnancy given. --Next visit in 4 weeks in office  - Obstetric panel - Hepatitis C Antibody - HIV antibody (with reflex) - Hemoglobinopathy Evaluation - Culture, OB Urine - GC/Chlamydia probe amp ([facility])not at [facility] - [place] bedside; Future  2. [redacted] weeks gestation of pregnancy   3. Nausea and vomiting during pregnancy prior to [redacted] weeks gestation --Pt taking doxylamine and B6 which helps some, having emesis 1-2 times per day on average, not happening every day. --Pt would like to try Emetrol PO, her friend recommended it --Ok to use [employer], pt to message office if she needs Rx for nausea medications.  4. Breast tenderness in female --Discussed, pt having significant discomfort.  Recommend ice/heat/comfortable bra/Tylenol  Initial labs drawn. Continue prenatal vitamins. Discussed and offered genetic screening options, including Quad screen/AFP, NIPS testing, and option to decline testing. Benefits/risks/alternatives reviewed. Pt aware that anatomy US is form of genetic screening with lower accuracy in detecting trisomies than blood work.  Pt chooses genetic screening today. NIPS: requested. Ultrasound discussed; fetal anatomic survey: requested. Problem list reviewed and updated. The nature of [facility] with multiple MDs and other Advanced Practice Providers was explained to patient; also emphasized that residents, students are part of our team. Routine obstetric precautions reviewed. No  follow-ups on file.   Sharen Counter, CNM 06/23/20 1:58 PM

## 2020-06-25 ENCOUNTER — Encounter: Payer: Self-pay | Admitting: Family Medicine

## 2020-06-25 ENCOUNTER — Other Ambulatory Visit: Payer: Self-pay | Admitting: Family Medicine

## 2020-06-25 LAB — URINE CULTURE, OB REFLEX

## 2020-06-25 LAB — GC/CHLAMYDIA PROBE AMP (~~LOC~~) NOT AT ARMC
Chlamydia: NEGATIVE
Comment: NEGATIVE
Comment: NORMAL
Neisseria Gonorrhea: NEGATIVE

## 2020-06-25 LAB — CULTURE, OB URINE

## 2020-06-26 ENCOUNTER — Telehealth: Payer: Self-pay | Admitting: *Deleted

## 2020-06-26 ENCOUNTER — Other Ambulatory Visit: Payer: Self-pay | Admitting: *Deleted

## 2020-06-26 NOTE — Telephone Encounter (Signed)
Called pt back and advised her that Dr. Linford Arnold would like for her to come in to discuss this medication and see what can be done for her considering that she is now pregnant.  appointment scheduled for 06/30/2020 @ 1010 AM

## 2020-06-26 NOTE — Telephone Encounter (Signed)
Spoke w/Dr. Linford Arnold she stated that she would like to have pt to come in to discuss her dosage because she shouldn't be taking that high of a dosage of Cymbalta.

## 2020-06-26 NOTE — Telephone Encounter (Signed)
Spoke w/pt to ask her about the dosage of the Cymbalta that she is taking. She informed me that she is taking 60 mg bid (total of 120 mg daily).   I told her that according to the my chart message that she was supposed to go down to 30 mg of the Cymbalta daily due to her being pregnant. She stated that she was told by her OB/GYN and the people at [facility] that she can continue to take the 120 mg of Cymbalta.   I told her that I would let Dr. Linford Arnold know about this and call her back about what she would like her to do.

## 2020-06-30 ENCOUNTER — Ambulatory Visit (INDEPENDENT_AMBULATORY_CARE_PROVIDER_SITE_OTHER): Payer: Federal, State, Local not specified - PPO | Admitting: Family Medicine

## 2020-06-30 ENCOUNTER — Encounter: Payer: Self-pay | Admitting: Family Medicine

## 2020-06-30 ENCOUNTER — Other Ambulatory Visit: Payer: Self-pay

## 2020-06-30 VITALS — BP 122/69 | HR 93 | Ht 68.0 in | Wt 235.0 lb

## 2020-06-30 DIAGNOSIS — F332 Major depressive disorder, recurrent severe without psychotic features: Secondary | ICD-10-CM | POA: Diagnosis not present

## 2020-06-30 DIAGNOSIS — Z3480 Encounter for supervision of other normal pregnancy, unspecified trimester: Secondary | ICD-10-CM

## 2020-06-30 MED ORDER — DULOXETINE HCL 30 MG PO CPEP
ORAL_CAPSULE | ORAL | 5 refills | Status: DC
Start: 2020-06-30 — End: 2020-07-11

## 2020-06-30 NOTE — Assessment & Plan Note (Signed)
Gust pros and cons of continuing SSRI during pregnancy.  We will continue with Cymbalta but will decrease down to 90 mg instead of 120 mg we will see if she tolerates this well I really like to have her off of the max dose if at all possible but if she feels like it significantly impacting her mental health then encouraged her to reach back out in a couple weeks and let me know.  We also discussed some strategies around really working on her mental health in general she is not interested in therapy/counseling at this point so we discussed really doing some dedicated meditation time daily as well as some active positive thinking so when she has a negative thought to counterbalance that with a positive thought and really working at that over these next coming months.  We also discussed that she is only able to control what she can and that is diet, exercise and getting adequate sleep and taking her prenatal vitamin and keeping up with her OB appointments.  Be on that what ever happens with the baby is really out of her hands so all she can do is her best to control which she can.  She has not started on a prenatal so we discussed just getting an over-the-counter prenatal that is perfectly fine she would prefer chewable.  And like to see her back in about 4 to 6 weeks to make sure that she is doing okay on the decreased dose and still working on some of the strategies as above.  Stressed the importance of getting some type of exercise daily.  She is interested in maybe doing some pregnancy yoga.  Also discussed just regular walking and just staying active in general and having a routine exercise regimen.  Will help her as well as the health of her baby.

## 2020-06-30 NOTE — Patient Instructions (Signed)
Mindfulness-Based Stress Reduction Mindfulness-based stress reduction (MBSR) is a program that helps people learn to practice mindfulness. Mindfulness is the practice of intentionally paying attention to the present moment. MBSR focuses on developing self-awareness, which allows you to respond to life stress without judgment or negative emotions. It can be learned and practiced through techniques such as education, breathing exercises, meditation, and yoga. MBSR includes several mindfulness techniques in one program. MBSR works best when you understand the treatment, are willing to try new things, and can commit to spending time practicing what you learn. MBSR training may include learning about:  How your emotions, thoughts, and reactions affect your body.  New ways to respond to things that cause negative thoughts to start (triggers).  How to notice your thoughts and let go of them.  Practicing awareness of everyday things that you normally do without thinking.  The techniques and goals of different types of meditation. What are the benefits of MBSR? MBSR can have many benefits, which include helping you to:  Develop self-awareness. This refers to knowing and understanding yourself.  Learn skills and attitudes that help you to participate in your own health care.  Learn new ways to care for yourself.  Be more accepting about how things are, and let things go.  Be less judgmental and approach things with an open mind.  Be patient with yourself and trust yourself more. MBSR has also been shown to:  Reduce negative emotions, such as depression and anxiety.  Improve memory and focus.  Change how you sense and approach pain.  Boost your body's ability to fight infections.  Help you connect better with other people.  Improve your sense of well-being. Follow these instructions at home:  Find a local in-person or online MBSR program.  Set aside some time regularly for  mindfulness practice.  Find a mindfulness practice that works best for you. This may include one or more of the following: ? Meditation. Meditation involves focusing your mind on a certain thought or activity. ? Breathing awareness exercises. These help you to stay present by focusing on your breath. ? Body scan. For this practice, you lie down and pay attention to each part of your body from head to toe. You can identify tension and soreness and intentionally relax parts of your body. ? Yoga. Yoga involves stretching and breathing, and it can improve your ability to move and be flexible. It can also provide an experience of testing your body's limits, which can help you release stress. ? Mindful eating. This way of eating involves focusing on the taste, texture, color, and smell of each bite of food. Because this slows down eating and helps you feel full sooner, it can be an important part of a weight-loss plan.  Find a podcast or recording that provides guidance for breathing awareness, body scan, or meditation exercises. You can listen to these any time when you have a free moment to rest without distractions.  Follow your treatment plan as told by your health care provider. This may include taking regular medicines and making changes to your diet or lifestyle as recommended.   How to practice mindfulness To do a basic awareness exercise:  Find a comfortable place to sit.  Pay attention to the present moment. Observe your thoughts, feelings, and surroundings just as they are.  Avoid placing judgment on yourself, your feelings, or your surroundings. Make note of any judgment that comes up, and let it go.  Your mind may wander, and that   is okay. Make note of when your thoughts drift, and return your attention to the present moment. To do basic mindfulness meditation:  Find a comfortable place to sit. This may include a stable chair or a firm floor cushion. ? Sit upright with your back  straight. Let your arms fall next to your side with your hands resting on your legs. ? If sitting in a chair, rest your feet flat on the floor. ? If sitting on a cushion, cross your legs in front of you.  Keep your head in a neutral position with your chin dropped slightly. Relax your jaw and rest the tip of your tongue on the roof of your mouth. Drop your gaze to the floor. You can close your eyes if you like.  Breathe normally and pay attention to your breath. Feel the air moving in and out of your nose. Feel your belly expanding and relaxing with each breath.  Your mind may wander, and that is okay. Make note of when your thoughts drift, and return your attention to your breath.  Avoid placing judgment on yourself, your feelings, or your surroundings. Make note of any judgment or feelings that come up, let them go, and bring your attention back to your breath.  When you are ready, lift your gaze or open your eyes. Pay attention to how your body feels after the meditation. Where to find more information You can find more information about MBSR from:  Your health care provider.  Community-based meditation centers or programs.  Programs offered near you. Summary  Mindfulness-based stress reduction (MBSR) is a program that teaches you how to intentionally pay attention to the present moment. It is used with other treatments to help you cope better with daily stress, emotions, and pain.  MBSR focuses on developing self-awareness, which allows you to respond to life stress without judgment or negative emotions.  MBSR programs may involve learning different mindfulness practices, such as breathing exercises, meditation, yoga, body scan, or mindful eating. Find a mindfulness practice that works best for you, and set aside time for it on a regular basis. This information is not intended to replace advice given to you by your health care provider. Make sure you discuss any questions you have  with your health care provider. Document Revised: 02/07/2020 Document Reviewed: 02/07/2020 Elsevier Patient Education  2021 [employer]  

## 2020-06-30 NOTE — Progress Notes (Signed)
Established Patient Office Visit  Subjective:  Patient ID: Leah Cervantes, female    DOB: [birth year]  Age: 20 y.o. MRN: [id number]  CC:  Chief Complaint  Patient presents with  . Depression  . Anxiety    HPI Leah Cervantes presents for low up of mood medication.  We did a virtual visit about 6 to 7 weeks ago she felt like her depression was worsening in part because she was working 60 hours/week and really not having a lot of downtime.  She had a new boyfriend who is quite supportive.  But just felt really stressed and overwhelmed again a lot of it was being driven by her long hours at work.  So we had bumped her Cymbalta from 90 mg up to 120 mg.  And then a few weeks later she actually found out that she was pregnant.  She says she does feel better on the 120 mg, then the 90 mg dose.  Unfortunately she has felt really nauseated with her first trimester.  She says she just has felt more down since she found out she was pregnant.  She just feels very anxious about the pregnancy she is worried that then something will happen with the baby that they are not going to be born healthy and she is worried that she is not going to be able to do her best to take care of the baby.  Past Medical History:  Diagnosis Date  . Depression     No past surgical history on file.  Family History  Problem Relation Age of Onset  . Hearing loss Mother   . Hypertension Mother   . Fibromyalgia Sister   . Stroke Maternal Grandmother   . Heart disease Maternal Grandmother   . Hypertension Maternal Grandmother   . Fibromyalgia Maternal Grandfather   . Raynaud syndrome Paternal Grandmother   . Heart attack Paternal Grandfather   . Bipolar disorder Maternal Uncle     Social History   Socioeconomic History  . Marital status: Single    Spouse name: Not on file  . Number of children: Not on file  . Years of education: Not on file  . Highest education level: Not on file  Occupational History  . Not  on file  Tobacco Use  . Smoking status: Current Every Day Smoker    Packs/day: 0.35  . Smokeless tobacco: Never Used  Vaping Use  . Vaping Use: Never used  Substance and Sexual Activity  . Alcohol use: Yes    Comment: occasional/very rare  . Drug use: No  . Sexual activity: Yes    Birth control/protection: None  Other Topics Concern  . Not on file  Social History Narrative  . Not on file   Social Determinants of Health   Financial Resource Strain: Not on file  Food Insecurity: Not on file  Transportation Needs: Not on file  Physical Activity: Not on file  Stress: Not on file  Social Connections: Not on file  Intimate Partner Violence: Not on file    Outpatient Medications Prior to Visit  Medication Sig Dispense Refill  . doxylamine, Sleep, (WAL-SOM) 25 MG tablet Take 25 mg by mouth at bedtime.    . Prenatal Vit-Fe Fumarate-FA (PRENATAL VITAMINS) 28-0.8 MG TABS Take 1 tablet by mouth daily. 30 tablet 5  . pyridOXINE (VITAMIN B-6) 25 MG tablet Take 1 tablet (25 mg total) by mouth at bedtime. 30 tablet 1  . DULoxetine (CYMBALTA) 60 MG capsule Take 1  capsule (60 mg total) by mouth 2 (two) times daily. 60 capsule 0   No facility-administered medications prior to visit.    Allergies  Allergen Reactions  . Prednisone Hives    ROS Review of Systems    Objective:    Physical Exam Constitutional:      Appearance: She is well-developed and well-nourished.  HENT:     Head: Normocephalic and atraumatic.  Cardiovascular:     Rate and Rhythm: Normal rate and regular rhythm.     Heart sounds: Normal heart sounds.  Pulmonary:     Effort: Pulmonary effort is normal.     Breath sounds: Normal breath sounds.  Skin:    General: Skin is warm and dry.  Neurological:     Mental Status: She is alert and oriented to person, place, and time.  Psychiatric:        Mood and Affect: Mood and affect normal.        Behavior: Behavior normal.     BP 122/69   Pulse 93   Ht 5\' 8"   (1.727 m)   Wt 235 lb (106.6 kg)   LMP 04/26/2020   SpO2 99%   BMI 35.73 kg/m  Wt Readings from Last 3 Encounters:  06/30/20 235 lb (106.6 kg)  06/23/20 235 lb (106.6 kg)  05/26/20 232 lb 11.2 oz (105.6 kg)     Health Maintenance Due  Topic Date Due  . PAP-Cervical Cytology Screening  06/24/2020  . PAP SMEAR-Modifier  06/24/2020    There are no preventive care reminders to display for this patient.  Lab Results  Component Value Date   TSH 2.374 11/10/2018   Lab Results  Component Value Date   WBC 8.3 05/26/2020   HGB 12.6 05/26/2020   HCT 37.7 05/26/2020   MCV 91.7 05/26/2020   PLT 312 05/26/2020   Lab Results  Component Value Date   NA 138 11/10/2018   K 3.7 11/10/2018   CO2 24 11/10/2018   GLUCOSE 92 11/10/2018   BUN 15 11/10/2018   CREATININE 0.86 11/10/2018   BILITOT 0.5 11/10/2018   ALKPHOS 89 11/10/2018   AST 22 11/10/2018   ALT 28 11/10/2018   PROT 7.8 11/10/2018   ALBUMIN 4.3 11/10/2018   CALCIUM 9.0 11/10/2018   ANIONGAP 9 11/10/2018   Lab Results  Component Value Date   CHOL 147 11/10/2018   Lab Results  Component Value Date   HDL 36 (L) 11/10/2018   Lab Results  Component Value Date   LDLCALC 87 11/10/2018   Lab Results  Component Value Date   TRIG 122 11/10/2018   Lab Results  Component Value Date   CHOLHDL 4.1 11/10/2018   Lab Results  Component Value Date   HGBA1C 5.3 11/10/2018      Assessment & Plan:   Problem List Items Addressed This Visit      Other   MDD (major depressive disorder), recurrent episode, severe (HCC) - Primary    Gust pros and cons of continuing SSRI during pregnancy.  We will continue with Cymbalta but will decrease down to 90 mg instead of 120 mg we will see if she tolerates this well I really like to have her off of the max dose if at all possible but if she feels like it significantly impacting her mental health then encouraged her to reach back out in a couple weeks and let me know.  We also  discussed some strategies around really working on her mental health in  general she is not interested in therapy/counseling at this point so we discussed really doing some dedicated meditation time daily as well as some active positive thinking so when she has a negative thought to counterbalance that with a positive thought and really working at that over these next coming months.  We also discussed that she is only able to control what she can and that is diet, exercise and getting adequate sleep and taking her prenatal vitamin and keeping up with her OB appointments.  Be on that what ever happens with the baby is really out of her hands so all she can do is her best to control which she can.  She has not started on a prenatal so we discussed just getting an over-the-counter prenatal that is perfectly fine she would prefer chewable.  And like to see her back in about 4 to 6 weeks to make sure that she is doing okay on the decreased dose and still working on some of the strategies as above.  Stressed the importance of getting some type of exercise daily.  She is interested in maybe doing some pregnancy yoga.  Also discussed just regular walking and just staying active in general and having a routine exercise regimen.  Will help her as well as the health of her baby.      Relevant Medications   DULoxetine (CYMBALTA) 30 MG capsule      Meds ordered this encounter  Medications  . DULoxetine (CYMBALTA) 30 MG capsule    Sig: 1 tab in AM and 2 tabs at Bedtime    Dispense:  90 capsule    Refill:  5    Follow-up: Return in about 4 weeks (around 07/28/2020) for change in med dose - ok for virtual if would like.    I spent 32 minutes on the day of the encounter to include pre-visit record review, face-to-face time with the patient and post visit ordering of test.  [name], MD

## 2020-07-06 ENCOUNTER — Telehealth: Payer: Self-pay

## 2020-07-06 NOTE — Telephone Encounter (Signed)
Pt called stating she is [redacted]w[redacted]d pregnant and has been "throwing up constantly for the past 5 days". Pt states "I cannot keep anything down". Pt was advised to go to [facility] for evaluation and possibly IV fluids. Pt expressed understanding.

## 2020-07-07 ENCOUNTER — Encounter: Payer: Federal, State, Local not specified - PPO | Admitting: Advanced Practice Midwife

## 2020-07-08 ENCOUNTER — Other Ambulatory Visit: Payer: Self-pay | Admitting: *Deleted

## 2020-07-08 ENCOUNTER — Encounter: Payer: Self-pay | Admitting: *Deleted

## 2020-07-08 MED ORDER — PROMETHAZINE HCL 25 MG PO TABS: 25.0000 mg | ORAL_TABLET | Freq: Four times a day (QID) | ORAL | 1 refills | Status: DC | PRN

## 2020-07-11 ENCOUNTER — Other Ambulatory Visit: Payer: Self-pay

## 2020-07-11 ENCOUNTER — Inpatient Hospital Stay (HOSPITAL_COMMUNITY)
Admission: AD | Admit: 2020-07-11 | Discharge: 2020-07-11 | Disposition: A | Discharge status: Home / Self Care | Payer: Federal, State, Local not specified - PPO | Attending: Obstetrics and Gynecology | Admitting: Obstetrics and Gynecology

## 2020-07-11 ENCOUNTER — Encounter (HOSPITAL_COMMUNITY): Payer: Self-pay | Admitting: Obstetrics and Gynecology

## 2020-07-11 DIAGNOSIS — F339 Major depressive disorder, recurrent, unspecified: Secondary | ICD-10-CM

## 2020-07-11 DIAGNOSIS — Z3A10 10 weeks gestation of pregnancy: Secondary | ICD-10-CM | POA: Insufficient documentation

## 2020-07-11 DIAGNOSIS — O99341 Other mental disorders complicating pregnancy, first trimester: Secondary | ICD-10-CM | POA: Insufficient documentation

## 2020-07-11 DIAGNOSIS — F32A Depression, unspecified: Secondary | ICD-10-CM | POA: Diagnosis not present

## 2020-07-11 DIAGNOSIS — O219 Vomiting of pregnancy, unspecified: Secondary | ICD-10-CM

## 2020-07-11 DIAGNOSIS — O210 Mild hyperemesis gravidarum: Secondary | ICD-10-CM

## 2020-07-11 DIAGNOSIS — Z888 Allergy status to other drugs, medicaments and biological substances status: Secondary | ICD-10-CM | POA: Insufficient documentation

## 2020-07-11 DIAGNOSIS — Z79899 Other long term (current) drug therapy: Secondary | ICD-10-CM | POA: Diagnosis not present

## 2020-07-11 DIAGNOSIS — R45851 Suicidal ideations: Secondary | ICD-10-CM | POA: Insufficient documentation

## 2020-07-11 DIAGNOSIS — Z87891 Personal history of nicotine dependence: Secondary | ICD-10-CM | POA: Diagnosis not present

## 2020-07-11 HISTORY — DX: Anxiety disorder, unspecified: F41.9

## 2020-07-11 LAB — URINALYSIS, ROUTINE W REFLEX MICROSCOPIC
Bilirubin Urine: NEGATIVE
Glucose, UA: NEGATIVE mg/dL
Hgb urine dipstick: NEGATIVE
Ketones, ur: NEGATIVE mg/dL
Leukocytes,Ua: NEGATIVE
Nitrite: NEGATIVE
Protein, ur: NEGATIVE mg/dL
Specific Gravity, Urine: 1.02 (ref 1.005–1.030)
pH: 5 (ref 5.0–8.0)

## 2020-07-11 MED ORDER — ONDANSETRON 4 MG PO TBDP
8.0000 mg | ORAL_TABLET | Freq: Once | ORAL | Status: AC
  Administered 2020-07-11: 8 mg via ORAL
  Filled 2020-07-11: qty 2

## 2020-07-11 MED ORDER — DULOXETINE HCL 60 MG PO CPEP: 60.0000 mg | ORAL_CAPSULE | Freq: Two times a day (BID) | ORAL | 3 refills | Status: DC

## 2020-07-11 MED ORDER — ONDANSETRON 8 MG PO TBDP: 8.0000 mg | ORAL_TABLET | Freq: Three times a day (TID) | ORAL | 6 refills | Status: DC | PRN

## 2020-07-11 NOTE — BH Assessment (Signed)
Comprehensive Clinical Assessment (CCA) Note  07/11/2020 Leah Cervantes [id number]  Chief Complaint:  Chief Complaint  Patient presents with  . Emesis  . Nausea   Visit Diagnosis:   Depression, recurrent Suicidal ideation  Leah Cervantes is a 20 yo female presenting to [facility] for assessment of pregnancy-related symptoms--but [place] assessment flagged up suicidal ideation. Pt admits suicidal ideation at times with vague plans of lying down in the street or overdosing. Pt reports that she has had three suicide attempts in the past via overdose. Pt reports significant mental health family history, including suicidal ideation and suicide attempts. Pt reports that she takes cymbalta 90 mg. Pt is not under the care of a psychiatrist or counselor at this point in time. Pt states that she is not interested in counseling because of the scheduling "when I am having a bad day, that's when I want to talk to someone, not just when they have me scheduled". Pt denies HI or AVH. Pt denies any paranoid ideation. Pt currently denying that she is a danger to herself or others  Weyman Pedro, MSW, LCSW Outpatient Therapist/Triage Specialist  Disposition: Per Nanine Means, DNP pt meets criteria for overnight observation with psychiatric provider reassessment in AM   CCA Screening, Triage and Referral (STR)  Patient Reported Information How did you hear about [place]? Self  Referral name: No data recorded Referral phone number: No data recorded  Whom do you see for routine medical problems? No data recorded Practice/Facility Name: No data recorded Practice/Facility Phone Number: No data recorded Name of Contact: No data recorded Contact Number: No data recorded Contact Fax Number: No data recorded Prescriber Name: No data recorded Prescriber Address (if known): No data recorded  What Is the Reason for Your Visit/Call Today? "I had something flag up when I was filling out questionnaires today"  How Long  Has This Been Causing You Problems? 1 wk - 1 month  What Do You Feel Would Help You the Most Today? Assessment Only   Have You Recently Been in Any Inpatient Treatment (Hospital/Detox/Crisis Center/28-Day Program)? No  Name/Location of Program/Hospital:No data recorded How Long Were You There? No data recorded When Were You Discharged? No data recorded  Have You Ever Received Services From [facility] Before? Yes  Who Do You See at [facility]? ED/Medical   Have You Recently Had Any Thoughts About Hurting Yourself? Yes  Are You Planning to Commit Suicide/Harm Yourself At This time? No   Have you Recently Had Thoughts About Hurting Someone Karolee Ohs? No  Explanation: No data recorded  Have You Used Any Alcohol or Drugs in the Past 24 Hours? No  How Long Ago Did You Use Drugs or Alcohol? No data recorded What Did You Use and How Much? No data recorded  Do You Currently Have a Therapist/Psychiatrist? No  Name of Therapist/Psychiatrist: No data recorded  Have You Been Recently Discharged From Any Office Practice or Programs? No  Explanation of Discharge From Practice/Program: No data recorded    CCA Screening Triage Referral Assessment Type of Contact: Tele-Assessment  Is this Initial or Reassessment? Initial Assessment  Date Telepsych consult ordered in [facility]:  07/11/2020  Time Telepsych consult ordered in [facility]:  No data recorded  Patient Reported Information Reviewed? Yes  Patient Left Without Being Seen? No data recorded Reason for Not Completing Assessment: No data recorded  Collateral Involvement: Fiance Tyshawn reports that he doesn't feel patient is a danger to herself at time of assessment   Does Patient Have a  Court Appointed Legal Guardian? No data recorded Name and Contact of Legal Guardian: No data recorded If Minor and Not Living with Parent(s), Who has Custody? No data recorded Is CPS involved or ever been involved? Never  Is APS involved or ever been  involved? Never   Patient Determined To Be At Risk for Harm To Self or Others Based on Review of Patient Reported Information or Presenting Complaint? Yes, for Self-Harm  Method: No data recorded Availability of Means: No data recorded Intent: No data recorded Notification Required: No data recorded Additional Information for Danger to Others Potential: No data recorded Additional Comments for Danger to Others Potential: No data recorded Are There Guns or Other Weapons in Your Home? No data recorded Types of Guns/Weapons: No data recorded Are These Weapons Safely Secured?                            No data recorded Who Could Verify You Are Able To Have These Secured: No data recorded Do You Have any Outstanding Charges, Pending Court Dates, Parole/Probation? No data recorded Contacted To Inform of Risk of Harm To Self or Others: No data recorded  Location of Assessment: [facility]   Does Patient Present under Involuntary Commitment? No  IVC Papers Initial File Date: No data recorded  [place] of Residence: [place]   Patient Currently Receiving the Following Services: Not Receiving Services   Determination of Need: No data recorded  Options For Referral: Other: Comment; Medication Management; Outpatient Therapy (overnight observation with provider psych reassessment in AM)     CCA Biopsychosocial Intake/Chief Complaint:  Leah Cervantes is a 20 yo female presenting to [facility] for assessment of pregnancy-related symptoms--but [place] assessment flagged up suicidal ideation.  Pt admits suicidal ideation at times with vague plans of lying down in the street or overdosing. Pt reports that she has had three suicide attempts in the past via overdose. Pt reports significant mental health family history, including suicidal ideation and suicide attempts. Pt reports that she takes cymbalta 90 mg. Pt is not under the care of a psychiatrist or counselor at this point in time. Pt states that she is not  interested in counseling because of the scheduling "when I am having a bad day, that's when I want to talk to someone, not just when they have me scheduled".  Pt denies HI or AVH. Pt denies any paranoid ideation.  Pt currently denying that she is a danger to herself or others  Current Symptoms/Problems: depression, anxiety, stress   Patient Reported Schizophrenia/Schizoaffective Diagnosis in Past: No   Strengths: good family and partner support  Preferences: psychiatric stability  Abilities: No data recorded  Type of Services Patient Feels are Needed: artistic activities, painting, reading   Initial Clinical Notes/Concerns: past history of suicidal ideation, inpatient hospitalizations   Mental Health Symptoms Depression:  Change in energy/activity; Difficulty Concentrating; Fatigue; Hopelessness; Increase/decrease in appetite; Irritability; Sleep (too much or little); Tearfulness; Weight gain/loss; Worthlessness   Duration of Depressive symptoms: Greater than two weeks   Mania:  Racing thoughts   Anxiety:   Difficulty concentrating; Fatigue; Irritability; Restlessness; Sleep; Tension; Worrying   Psychosis:  None   Duration of Psychotic symptoms: No data recorded  Trauma:  Avoids reminders of event; Detachment from others; Re-experience of traumatic event   Obsessions:  None   Compulsions:  None   Inattention:  None   Hyperactivity/Impulsivity:  N/A   Oppositional/Defiant Behaviors:  None   Emotional  Irregularity:  Mood lability   Other Mood/Personality Symptoms:  No data recorded   Mental Status Exam Appearance and self-care  Stature:  Average   Weight:  Average weight   Clothing:  Neat/clean   Grooming:  Normal   Cosmetic use:  None   Posture/gait:  Normal   Motor activity:  Not Remarkable   Sensorium  Attention:  Normal   Concentration:  Normal   Orientation:  X5   Recall/memory:  Normal   Affect and Mood  Affect:  Anxious; Depressed    Mood:  Anxious; Depressed   Relating  Eye contact:  Normal   Facial expression:  Depressed; Anxious   Attitude toward examiner:  Cooperative   Thought and Language  Speech flow: Clear and Coherent   Thought content:  Appropriate to Mood and Circumstances   Preoccupation:  None   Hallucinations:  None   Organization:  No data recorded  [employer] of Knowledge:  Fair   Intelligence:  Average   Abstraction:  Functional   Judgement:  Fair   Dance movement psychotherapist:  Variable   Insight:  Gaps   Decision Making:  Impulsive   Social Functioning  Social Maturity:  Responsible; Isolates   Social Judgement:  Normal   Stress  Stressors:  Transitions; Work   Coping Ability:  Normal   Skill Deficits:  None   Supports:  Family; Friends/Service system     Religion: Religion/Spirituality Are You A Religious Person?: Yes  Leisure/Recreation: Leisure / Recreation Do You Have Hobbies?: Yes Leisure and Hobbies: Art, listening to music, being outside  Exercise/Diet: Exercise/Diet Do You Exercise?: No Have You Gained or Lost A Significant Amount of Weight in the Past Six Months?: No Do You Have Any Trouble Sleeping?: Yes Explanation of Sleeping Difficulties: insomnia   CCA Employment/Education Employment/Work Situation: Employment / Work Situation Employment situation: Employed Where is patient currently employed?: Pt is working for [employer] job has been impacted by current illness: Yes What is the longest time patient has a held a job?: see above Has patient ever been in the [employer]?: No  Education: Education Last Grade Completed: 14 Name of High School: [employer] in [place] Did You Graduate From [employer]?: Yes Did Theme park manager?: Yes Did Designer, television/film set?: No Did You Have Any Special Interests In School?: n/a Did You Have An Individualized Education Program (IIEP): No Did You Have Any Difficulty At [facility]?:  No (mix p,d and q's, b's and read numbers out of order. have to recheck and check)   CCA Family/Childhood History Family and Relationship History: Family history Are you sexually active?: Yes What is your sexual orientation?: I don't care about the sex it is more about who is good to me and treats me nice Has your sexual activity been affected by drugs, alcohol, medication, or emotional stress?: no Does patient have children?: No  Childhood History:  Childhood History By whom was/is the patient raised?: Both parents Additional childhood history information: parents divorced and reasided by mom, grandmother on dad's side, father and step-father. They when she was 2 Description of patient's relationship with caregiver when they were a child: great relationship-always aimed to please, with dad, step-dad and mom How were you disciplined when you got in trouble as a child/adolescent?: rarely got in trouble and only was spanked 2-3 times in my life. Does patient have siblings?: Yes Description of patient's current relationship with siblings: 2 half-sister, step sister and 4 step brothers-hardly evern  see brothers, I see half-sister regularly and step sister once in awhile Did patient suffer any verbal/emotional/physical/sexual abuse as a child?: No Has patient ever been sexually abused/assaulted/raped as an adolescent or adult?: No Witnessed domestic violence?: Yes Has patient been affected by domestic violence as an adult?: No  Child/Adolescent Assessment:     CCA Substance Use Alcohol/Drug Use: Alcohol / Drug Use Pain Medications: see MAR Prescriptions: see MAR Over the Counter: see MAR History of alcohol / drug use?: Yes (lost virtual connection will have to complete for follow up session) Substance #1 Name of Substance 1: etoh--rare. none since Dec 2021 Substance #2 Name of Substance 2: marijuana--rare.  None since Dec. 2021     ASAM's:  Six Dimensions of Multidimensional  Assessment  Dimension 1:  Acute Intoxication and/or Withdrawal Potential:   Dimension 1:  Description of individual's past and current experiences of substance use and withdrawal: n/a  Dimension 2:  Biomedical Conditions and Complications:      Dimension 3:  Emotional, Behavioral, or Cognitive Conditions and Complications:     Dimension 4:  Readiness to Change:     Dimension 5:  Relapse, Continued use, or Continued Problem Potential:     Dimension 6:  Recovery/Living Environment:     ASAM Severity Score: ASAM's Severity Rating Score: 0  ASAM Recommended Level of Treatment:     Substance use Disorder (SUD)    Recommendations for Services/Supports/Treatments: Recommendations for Services/Supports/Treatments Recommendations For Services/Supports/Treatments: Other (Comment) (overnight obs with psych provider reassessment in AM)  DSM5 Diagnoses: Patient Active Problem List   Diagnosis Date Noted  . Supervision of other normal pregnancy, antepartum 06/23/2020  . Lumbar radiculopathy 03/26/2020  . Encounter for initial prescription of contraceptive pills 08/01/2019  . MDD (major depressive disorder), recurrent episode, severe (HCC) 11/09/2018  . Paresthesias 12/06/2017  . Vestibular migraine 12/06/2017  . Major depression, recurrent, chronic (HCC) 08/26/2016  . GAD (generalized anxiety disorder) 08/26/2016  . Raynaud's disease without gangrene 06/13/2016  . KNEE PAIN 09/24/2010  . HEADACHE 07/05/2010  . ELBOW PAIN, RIGHT 03/22/2010    Referrals to Alternative Service(s): Referred to Alternative Service(s):   Place:   Date:   Time:    Referred to Alternative Service(s):   Place:   Date:   Time:    Referred to Alternative Service(s):   Place:   Date:   Time:    Referred to Alternative Service(s):   Place:   Date:   Time:     Ernest Haber Jaecion Dempster, LCSW

## 2020-07-11 NOTE — Discharge Instructions (Signed)
Phenergan (promethezine) each night for nausea Zofran every hours during the day Cymbalta 120mg  daily Small frequent meals throughout the day  (crackers, cheese, grapes, jolly ranchers, lemonheads) Switch to gummy prenatal vitamins  Work note provided to return to work on 07/13/2020  Morning Sickness  Morning sickness is when you feel like you may vomit (feel nauseous) during pregnancy. Sometimes, you may vomit. Morning sickness most often happens in the morning, but it can also happen at any time of the day. Some women may have morning sickness that makes them vomit all the time. This is a more serious problem that needs treatment. What are the causes? The cause of this condition is not known. What increases the risk?  You had vomiting or a feeling like you may vomit before your pregnancy.  You had morning sickness in another pregnancy.  You are pregnant with more than one baby, such as twins. What are the signs or symptoms?  Feeling like you may vomit.  Vomiting. How is this treated? Treatment is usually not needed for this condition. You may only need to change what you eat. In some cases, your doctor may give you some things to take for your condition. These include:  Vitamin B6 supplements.  Medicines to treat the feeling that you may vomit.  Ginger. Follow these instructions at home: Medicines  Take over-the-counter and prescription medicines only as told by your doctor. Do not take any medicines until you talk with your doctor about them first.  Take multivitamins before you get pregnant. These can stop or lessen the symptoms of morning sickness. Eating and drinking  Eat dry toast or crackers before getting out of bed.  Eat 5 or 6 small meals a day.  Eat dry and bland foods like rice and baked potatoes.  Do not eat greasy, fatty, or spicy foods.  Have someone cook for you if the smell of food causes you to vomit or to feel like you may vomit.  If you feel like  you may vomit after taking prenatal vitamins, take them at night or with a snack.  Eat protein foods when you need a snack. Nuts, yogurt, and cheese are good choices.  Drink fluids throughout the day.  Try ginger ale made with real ginger, ginger tea made from fresh grated ginger, or ginger candies. General instructions  Do not smoke or use any products that contain nicotine or tobacco. If you need help quitting, ask your doctor.  Use an air purifier to keep the air in your house free of smells.  Get lots of fresh air.  Try to avoid smells that make you feel sick.  Try wearing an acupressure wristband. This is a wristband that is used to treat seasickness.  Try a treatment called acupuncture. In this treatment, a doctor puts needles into certain areas of your body to make you feel better. Contact a doctor if:  You need medicine to feel better.  You feel dizzy or light-headed.  You are losing weight. Get help right away if:  The feeling that you may vomit will not go away, or you cannot stop vomiting.  You faint.  You have very bad pain in your belly. Summary  Morning sickness is when you feel like you may vomit (feel nauseous) during pregnancy.  You may feel sick in the morning, but you can feel this way at any time of the day.  Making some changes to what you eat may help your symptoms go away. This information  is not intended to replace advice given to you by your health care provider. Make sure you discuss any questions you have with your health care provider. Document Revised: 01/06/2020 Document Reviewed: 12/16/2019 Elsevier Patient Education  2021 [employer].

## 2020-07-11 NOTE — MAU Note (Signed)
Pt reports to mau with c/o nausea and vomiting for the past few days.  Pt reports she is unable to keep any food or liquids down.  Pt reports she was given nausea meds but has stopped taking them because "they make my urine stink".  Pt denies vag bleeding or abd cramping at this time.

## 2020-07-11 NOTE — BH Assessment (Addendum)
Per Leah Means, DNP pt needs overnight observation and psych consult in AM (face to face).    Leah Cervantes is a 20 yo female presenting to [facility] for assessment of pregnancy-related symptoms--but [place] assessment flagged up suicidal ideation.  Pt admits suicidal ideation at times with vague plans of lying down in the street or overdosing. Pt reports that she has had three suicide attempts in the past via overdose. Pt reports significant mental health family history, including suicidal ideation and suicide attempts. Pt reports that she takes cymbalta 90 mg. Pt is not under the care of a psychiatrist or counselor at this point in time. Pt states that she is not interested in counseling because of the scheduling "when I am having a bad day, that's when I want to talk to someone, not just when they have me scheduled".  Pt denies HI or AVH. Pt denies any paranoid ideation.  Pt currently denying that she is a danger to herself or others.   Weyman Pedro, MSW, LCSW Outpatient Therapist/Triage Specialist

## 2020-07-11 NOTE — MAU Provider Note (Addendum)
History     CSN: [id number]  Arrival date and time: 07/11/20 1657   Event Date/Time   First Provider Initiated Contact with Patient 07/11/20 1812      Chief Complaint  Patient presents with  . Emesis  . Nausea   Leah Cervantes is a 20 y.o. G1P0 at [redacted]w[redacted]d who presents today with nausea and vomiting. This has been ongoing, and was on B6 and unisom, but she stopped the B6. She was prescribed phenergan on 2/2, but reports that this isn't helping. She last took it yesterday.   Patient also reports depressed mood and responded positively to questions regarding self-harm. Patient was on cymbalta 120mg , but her PCP reduced her dose on 06/30/2020. Pt was reduced down to 90mg  at that time. PCP did not want patient on the max dose during pregnancy. Patient has tried multiple anti-depressants over the years and has not have much success with any of them. However she was doing well on the 120mg  dose of cymbalta.     OB History    Gravida  1   Para      Term      Preterm      AB      Living        SAB      IAB      Ectopic      Multiple      Live Births              Past Medical History:  Diagnosis Date  . Anxiety   . Depression     Past Surgical History:  Procedure Laterality Date  . NO PAST SURGERIES      Family History  Problem Relation Age of Onset  . Hearing loss Mother   . Hypertension Mother   . Fibromyalgia Sister   . Stroke Maternal Grandmother   . Heart disease Maternal Grandmother   . Hypertension Maternal Grandmother   . Fibromyalgia Maternal Grandfather   . Raynaud syndrome Paternal Grandmother   . Heart attack Paternal Grandfather   . Bipolar disorder Maternal Uncle   . Depression Father     Social History   Tobacco Use  . Smoking status: Former Smoker    Packs/day: 0.35  . Smokeless tobacco: Never Used  Vaping Use  . Vaping Use: Never used  Substance Use Topics  . Alcohol use: Not Currently    Comment: occasional/very rare  .  Drug use: No    Allergies:  Allergies  Allergen Reactions  . Prednisone Hives    Medications Prior to Admission  Medication Sig Dispense Refill Last Dose  . DULoxetine (CYMBALTA) 30 MG capsule 1 tab in AM and 2 tabs at Bedtime 90 capsule 5 07/10/2020 at 0600  . Prenatal Vit-Fe Fumarate-FA (PRENATAL VITAMINS) 28-0.8 MG TABS Take 1 tablet by mouth daily. 30 tablet 5 07/10/2020 at 0600  . promethazine (PHENERGAN) 25 MG tablet Take 1 tablet (25 mg total) by mouth every 6 (six) hours as needed for nausea or vomiting. 30 tablet 1 07/10/2020 at 0600  . doxylamine, Sleep, (WAL-SOM) 25 MG tablet Take 25 mg by mouth at bedtime.     . pyridOXINE (VITAMIN B-6) 25 MG tablet Take 1 tablet (25 mg total) by mouth at bedtime. 30 tablet 1     Review of Systems Physical Exam   Blood pressure 130/74, pulse 81, temperature 98.3 F (36.8 C), temperature source Oral, resp. rate 18, height 5\' 8"  (1.727 m), weight 105.3 kg, last  menstrual period 04/26/2020, SpO2 100 %.  Physical Exam Vitals and nursing note reviewed.  Constitutional:      General: She is not in acute distress. HENT:     Head: Normocephalic.  Eyes:     Pupils: Pupils are equal, round, and reactive to light.  Cardiovascular:     Rate and Rhythm: Normal rate.  Pulmonary:     Effort: Pulmonary effort is normal.  Abdominal:     Palpations: Abdomen is soft.     Tenderness: There is no abdominal tenderness.  Skin:    General: Skin is warm and dry.  Neurological:     Mental Status: She is alert and oriented to person, place, and time.  Psychiatric:        Mood and Affect: Mood is depressed.        Behavior: Behavior normal.        Thought Content: Thought content normal.        Judgment: Judgment normal.     MAU Course  Procedures  MDM  TTS consult ordered. [facility] did not understand that we were an ED type unit so they had intially said that they could not do TTS and psych would round on the patient in the morning. Patient is not being  admitted at this time. She has a supportive partner and no clear suicide plan. She is mostly upset due to the fact that her medication dosage had been decreased and that she was having so much nausea and vomiting. She feels that if she can get the nausea and vomiting under control she will feel better. In addition she feels she needs to have her medication returned to her normal 120mg  dose.   I had a very long conversation with the patient and her partner lasting aproxx 30 mins face to face. Patient expresses that she does not have a plan. Her partner is supportive and approprietly caring. They both know they can return here or to the [facility] urgent care.  Assessment and Plan   1. Nausea/vomiting in pregnancy   2. [redacted] weeks gestation of pregnancy    DC home in stable condition Plan:  Phenergan each night for nausea Zofran q hours during the day Cymbalta 120mg  daily Small frequent meals throughout the day Work note provided to return to work on 07/13/2020 RX: zofran and cymbalta 60mg  BID  Return to MAU as needed FU with OB as planned   Follow-up Information    [facility].   Specialty: Obstetrics and Gynecology Contact information: [address] [place] [address] [phone]              [place] DNP, CNM  07/11/20  10:00 PM

## 2020-07-11 NOTE — MAU Note (Signed)
Contacted Behavioral [facility], Joann, in regards to following up if Behavioral Health had any available beds for admission per request of Christina Hussami, LCSW who had a telepsych visit with patient .  [facility] provided pt's MRN # and I was informed that she would call me back with advisement.    Addison Naegeli, RN  07/11/20

## 2020-07-11 NOTE — Progress Notes (Signed)
Pt denies current thoughts of self harm or suicide.  States she sometimes has thoughts but knows she needs to be here to raise her baby.

## 2020-07-11 NOTE — MAU Note (Signed)
MAU charge nurse Ralene Bathe reached out to [facility], LCSW to clarify note in patient chart via secure chat. LCSW called unit.  Levan Aloia Health and safety inspector RN spoke to [employer] over phone and LCSW stated that patient needed face-to-face consult instead of telepsych consult d/t suicidal ideation.  LCSW had went ahead and conducted a telepsych consultation with patient and then relayed information to her director who advised patient be admitted and then seen face-to-face in the AM.  I asked LCSW if she could speak to our provider in MAU to answer any questions and she stated that our provider could secure chat her to ask questions or to get her director's contact information.   Skie Vitrano RN made MAU charge nurse and Thressa Sheller CNM aware of situation.  Charge nurse Ralene Bathe contacted [facility] to check on bed status.  BH AC to review chart and return call. Thressa Sheller CNM stated she would try to contact Nanine Means, DNP and clarify plan of care/request orders.

## 2020-07-11 NOTE — Progress Notes (Signed)
Pt currently doing consult with behavioral health counselor via Telepsych

## 2020-07-13 ENCOUNTER — Other Ambulatory Visit: Payer: Self-pay | Admitting: *Deleted

## 2020-07-13 DIAGNOSIS — F419 Anxiety disorder, unspecified: Secondary | ICD-10-CM

## 2020-07-17 ENCOUNTER — Telehealth (INDEPENDENT_AMBULATORY_CARE_PROVIDER_SITE_OTHER): Payer: Federal, State, Local not specified - PPO | Admitting: Medical-Surgical

## 2020-07-17 ENCOUNTER — Encounter: Payer: Self-pay | Admitting: Family Medicine

## 2020-07-17 ENCOUNTER — Encounter: Payer: Self-pay | Admitting: Medical-Surgical

## 2020-07-17 DIAGNOSIS — K649 Unspecified hemorrhoids: Secondary | ICD-10-CM

## 2020-07-17 DIAGNOSIS — O99611 Diseases of the digestive system complicating pregnancy, first trimester: Secondary | ICD-10-CM | POA: Diagnosis not present

## 2020-07-17 DIAGNOSIS — K5900 Constipation, unspecified: Secondary | ICD-10-CM

## 2020-07-17 NOTE — Telephone Encounter (Signed)
Have her do a virtual. Maybe we can call in some cream for relief and then we can do note. She is correct we cannot do note if we have not seen her for this issue

## 2020-07-17 NOTE — Progress Notes (Signed)
Virtual Visit via Video Note  I connected with Leah Cervantes on 07/17/20 at  4:00 PM EST by a video enabled telemedicine application and verified that I am speaking with the correct person using two identifiers.   I discussed the limitations of evaluation and management by telemedicine and the availability of in person appointments. The patient expressed understanding and agreed to proceed.  Patient location: home Provider locations: office  Subjective:    CC: constipation  HPI: Pleasant 20 year old female presenting today via MyChart video visit with complaints of constipation.  She is [redacted] weeks pregnant as of tomorrow and notes that starting on Monday, her constipation was severe enough that she developed what she thought was a rectal prolapse.  Notes that she developed a bulging, red, painful area at her anus on Monday which caused her to miss time from work.  She investigated using online resources and proceeded to get stool softeners which seem to have helped.  She has been having difficulty keeping food and fluids down due to severe nausea.  This seems to be letting up and she has been able to increase her fluid intake this week.  Her pain is now nearly at discomfort and considered mild.  Attempted to return to work today but they would not allow her back without a doctor's note.  Past medical history, Surgical history, Family history not pertinant except as noted below, Social history, Allergies, and medications have been entered into the medical record, reviewed, and corrections made.   Review of Systems: See HPI for pertinent positives and negatives.   Objective:    General: Speaking clearly in complete sentences without any shortness of breath.  Alert and oriented x3.  Normal judgment. No apparent acute distress.  Impression and Recommendations:    1. Constipation during pregnancy in first trimester Continue stool softeners as needed.  Please make sure to drink plenty of water  and increase dietary fiber to prevent further constipation issues.  2. Hemorrhoids, unspecified hemorrhoid type From description and association with constipation, feel that she has an external hemorrhoid.  Discussed that these are very common in pregnancy and affect up to 35% of women.  The best treatment is to prevent constipation.  Topical treatments can be used in pregnancy with caution.  Patient declined topical treatment at this time reporting that the discomfort is very mild and much improved.  Work note provided via Clinical cytogeneticist and print copy to excuse her time missed from work and allow for her return on her next regularly scheduled day.  I discussed the assessment and treatment plan with the patient. The patient was provided an opportunity to ask questions and all were answered. The patient agreed with the plan and demonstrated an understanding of the instructions.   The patient was advised to call back or seek an in-person evaluation if the symptoms worsen or if the condition fails to improve as anticipated.  20 minutes of non-face-to-face time was provided during this encounter.  Return if symptoms worsen or fail to improve.  Thayer Ohm, DNP, APRN, FNP-BC [facility]

## 2020-07-21 ENCOUNTER — Other Ambulatory Visit: Payer: Self-pay

## 2020-07-21 ENCOUNTER — Ambulatory Visit (INDEPENDENT_AMBULATORY_CARE_PROVIDER_SITE_OTHER): Payer: Federal, State, Local not specified - PPO | Admitting: Advanced Practice Midwife

## 2020-07-21 VITALS — BP 120/76 | HR 109 | Wt 232.0 lb

## 2020-07-21 DIAGNOSIS — Z283 Underimmunization status: Secondary | ICD-10-CM

## 2020-07-21 DIAGNOSIS — Z3A12 12 weeks gestation of pregnancy: Secondary | ICD-10-CM | POA: Diagnosis not present

## 2020-07-21 DIAGNOSIS — O99891 Other specified diseases and conditions complicating pregnancy: Secondary | ICD-10-CM

## 2020-07-21 DIAGNOSIS — Z3480 Encounter for supervision of other normal pregnancy, unspecified trimester: Secondary | ICD-10-CM

## 2020-07-21 DIAGNOSIS — O219 Vomiting of pregnancy, unspecified: Secondary | ICD-10-CM

## 2020-07-21 DIAGNOSIS — O99211 Obesity complicating pregnancy, first trimester: Secondary | ICD-10-CM | POA: Diagnosis not present

## 2020-07-21 DIAGNOSIS — O09899 Supervision of other high risk pregnancies, unspecified trimester: Secondary | ICD-10-CM

## 2020-07-21 MED ORDER — ASPIRIN EC 81 MG PO TBEC: 81.0000 mg | DELAYED_RELEASE_TABLET | Freq: Every day | ORAL | 6 refills | Status: DC

## 2020-07-21 NOTE — Progress Notes (Signed)
   PRENATAL VISIT NOTE  Subjective:  Leah Cervantes is a 20 y.o. G1P0 at [redacted]w[redacted]d being seen today for ongoing prenatal care.  She is currently monitored for the following issues for this low-risk pregnancy and has ELBOW PAIN, RIGHT; HEADACHE; KNEE PAIN; Raynaud's disease without gangrene; Depression, recurrent (HCC); GAD (generalized anxiety disorder); Paresthesias; Vestibular migraine; MDD (major depressive disorder), recurrent episode, severe (HCC); Encounter for initial prescription of contraceptive pills; Lumbar radiculopathy; Supervision of other normal pregnancy, antepartum; and Suicidal ideation on their problem list.  Patient reports no complaints.  Contractions: Not present. Vag. Bleeding: None.  Movement: Present. Denies leaking of fluid.   The following portions of the patient's history were reviewed and updated as appropriate: allergies, current medications, past family history, past medical history, past social history, past surgical history and problem list.   Objective:   Vitals:   07/21/20 1314  BP: 120/76  Pulse: (!) 109  Weight: 232 lb (105.2 kg)    Fetal Status: Fetal Heart Rate (bpm): 156   Movement: Present     General:  Alert, oriented and cooperative. Patient is in no acute distress.  Skin: Skin is warm and dry. No rash noted.   Cardiovascular: Normal heart rate noted  Respiratory: Normal respiratory effort, no problems with respiration noted  Abdomen: Soft, gravid, appropriate for gestational age.  Pain/Pressure: Absent     Pelvic: Cervical exam deferred        Extremities: Normal range of motion.  Edema: None  Mental Status: Normal mood and affect. Normal behavior. Normal judgment and thought content.   Assessment and Plan:  Pregnancy: G1P0 at [redacted]w[redacted]d 1. Obesity affecting pregnancy in first trimester - aspirin EC 81 MG tablet; Take 1 tablet (81 mg total) by mouth daily. Swallow whole.  Dispense: 30 tablet; Refill: 6  2. Supervision of other normal pregnancy,  antepartum --Anticipatory guidance about next visits/weeks of pregnancy given. --Next visit in 4 weeks  --NIPS and prenatal labs drawn today  3. [redacted] weeks gestation of pregnancy   4. Nausea and vomiting during pregnancy prior to [redacted] weeks gestation --Improved, no longer needing medications  Preterm labor symptoms and general obstetric precautions including but not limited to vaginal bleeding, contractions, leaking of fluid and fetal movement were reviewed in detail with the patient. Please refer to After Visit Summary for other counseling recommendations.   No follow-ups on file.  No future appointments.  Sharen Counter, CNM

## 2020-07-22 LAB — OBSTETRIC PANEL
Absolute Monocytes: 388 cells/uL (ref 200–950)
Antibody Screen: NOT DETECTED
Basophils Absolute: 29 cells/uL (ref 0–200)
Basophils Relative: 0.5 %
Eosinophils Absolute: 160 cells/uL (ref 15–500)
Eosinophils Relative: 2.8 %
HCT: 38.6 % (ref 35.0–45.0)
Hemoglobin: 13.5 g/dL (ref 11.7–15.5)
Hepatitis B Surface Ag: NONREACTIVE
Lymphs Abs: 1368 cells/uL (ref 850–3900)
MCH: 31.4 pg (ref 27.0–33.0)
MCHC: 35 g/dL (ref 32.0–36.0)
MCV: 89.8 fL (ref 80.0–100.0)
MPV: 12.3 fL (ref 7.5–12.5)
Monocytes Relative: 6.8 %
Neutro Abs: 3756 cells/uL (ref 1500–7800)
Neutrophils Relative %: 65.9 %
Platelets: 252 10*3/uL (ref 140–400)
RBC: 4.3 10*6/uL (ref 3.80–5.10)
RDW: 12.6 % (ref 11.0–15.0)
RPR Ser Ql: NONREACTIVE
Rubella: 0.98 Index — ABNORMAL LOW
Total Lymphocyte: 24 %
WBC: 5.7 10*3/uL (ref 3.8–10.8)

## 2020-07-22 LAB — HIV ANTIBODY (ROUTINE TESTING W REFLEX): HIV 1&2 Ab, 4th Generation: NONREACTIVE

## 2020-07-22 LAB — HEMOGLOBIN A1C
Hgb A1c MFr Bld: 5.3 % of total Hgb (ref <5.7)
Mean Plasma Glucose: 105 mg/dL
eAG (mmol/L): 5.8 mmol/L

## 2020-07-22 LAB — HEPATITIS C ANTIBODY
Hepatitis C Ab: NONREACTIVE
SIGNAL TO CUT-OFF: 0.01 (ref <1.00)

## 2020-07-27 DIAGNOSIS — Z2839 Other underimmunization status: Secondary | ICD-10-CM | POA: Insufficient documentation

## 2020-07-27 DIAGNOSIS — O99891 Other specified diseases and conditions complicating pregnancy: Secondary | ICD-10-CM | POA: Insufficient documentation

## 2020-07-28 ENCOUNTER — Ambulatory Visit: Payer: Federal, State, Local not specified - PPO | Admitting: Family Medicine

## 2020-07-28 ENCOUNTER — Encounter: Payer: Self-pay | Admitting: *Deleted

## 2020-08-18 ENCOUNTER — Encounter: Payer: Self-pay | Admitting: Certified Nurse Midwife

## 2020-08-18 ENCOUNTER — Other Ambulatory Visit: Payer: Self-pay

## 2020-08-18 ENCOUNTER — Ambulatory Visit (INDEPENDENT_AMBULATORY_CARE_PROVIDER_SITE_OTHER): Payer: Federal, State, Local not specified - PPO | Admitting: Certified Nurse Midwife

## 2020-08-18 VITALS — BP 135/80 | HR 108 | Wt 236.0 lb

## 2020-08-18 DIAGNOSIS — F339 Major depressive disorder, recurrent, unspecified: Secondary | ICD-10-CM

## 2020-08-18 DIAGNOSIS — O99891 Other specified diseases and conditions complicating pregnancy: Secondary | ICD-10-CM

## 2020-08-18 DIAGNOSIS — O09899 Supervision of other high risk pregnancies, unspecified trimester: Secondary | ICD-10-CM

## 2020-08-18 DIAGNOSIS — Z3A16 16 weeks gestation of pregnancy: Secondary | ICD-10-CM

## 2020-08-18 DIAGNOSIS — O99210 Obesity complicating pregnancy, unspecified trimester: Secondary | ICD-10-CM | POA: Diagnosis not present

## 2020-08-18 DIAGNOSIS — Z283 Underimmunization status: Secondary | ICD-10-CM

## 2020-08-18 DIAGNOSIS — Z3480 Encounter for supervision of other normal pregnancy, unspecified trimester: Secondary | ICD-10-CM

## 2020-08-18 NOTE — Progress Notes (Signed)
   PRENATAL VISIT NOTE  Subjective:  Leah Cervantes is a 20 y.o. G1P0 at [redacted]w[redacted]d being seen today for ongoing prenatal care.  She is currently monitored for the following issues for this low-risk pregnancy and has Raynaud's disease without gangrene; Depression, recurrent ([place]); GAD (generalized anxiety disorder); Paresthesias; Vestibular migraine; MDD (major depressive disorder), recurrent episode, severe ([place]); Lumbar radiculopathy; Supervision of other normal pregnancy, antepartum; Suicidal ideation; Rubella non-immune status, antepartum; and Obesity during pregnancy on their problem list.  Patient reports no complaints.  Contractions: Not present. Vag. Bleeding: None.  Movement: Absent. Denies leaking of fluid.   The following portions of the patient's history were reviewed and updated as appropriate: allergies, current medications, past family history, past medical history, past social history, past surgical history and problem list.   Objective:   Vitals:   08/18/20 1329  BP: 135/80  Pulse: (!) 108  Weight: 236 lb (107 kg)    Fetal Status: Fetal Heart Rate (bpm): 154   Movement: Absent     General:  Alert, oriented and cooperative. Patient is in no acute distress.  Skin: Skin is warm and dry. No rash noted.   Cardiovascular: Normal heart rate noted  Respiratory: Normal respiratory effort, no problems with respiration noted  Abdomen: Soft, gravid, appropriate for gestational age.  Pain/Pressure: Absent     Pelvic: Cervical exam deferred        Extremities: Normal range of motion.  Edema: None  Mental Status: Normal mood and affect. Normal behavior. Normal judgment and thought content.   Assessment and Plan:  Pregnancy: G1P0 at [redacted]w[redacted]d 1. [redacted] weeks gestation of pregnancy - Alpha fetoprotein, maternal  2. Supervision of other normal pregnancy, antepartum - Patient doing well, no complaints or concerns at this time  - Routine prenatal care - Anticipatory guidance on upcoming  appointments including anatomy US  - Educated and discussed fetal movement during pregnancy around 18-20 weeks  - [place] MFM OB DETAIL +14 WK; Future  3. Rubella non-immune status, antepartum - MMR PP   4. Depression, recurrent ([place]) - Patient currently taking Cymbalta, reports doing well on medication and feeling good  - discussed with patient to let office know immediately if anything changes   5. Obesity during pregnancy - continue bASA  - [place] MFM OB DETAIL +14 WK; Future  Preterm labor symptoms and general obstetric precautions including but not limited to vaginal bleeding, contractions, leaking of fluid and fetal movement were reviewed in detail with the patient. Please refer to After Visit Summary for other counseling recommendations.   Return in about 4 weeks (around 09/15/2020) for LROB, in person.  Future Appointments  Date Time Provider [place]  09/09/2020  1:45 PM [facility] US5 [facility]  09/17/2020  1:15 PM Constant, Vickii Chafe, MD [place], CNM

## 2020-08-27 ENCOUNTER — Other Ambulatory Visit: Payer: Self-pay

## 2020-08-27 ENCOUNTER — Encounter: Payer: Self-pay | Admitting: Certified Nurse Midwife

## 2020-08-27 DIAGNOSIS — O280 Abnormal hematological finding on antenatal screening of mother: Secondary | ICD-10-CM | POA: Insufficient documentation

## 2020-08-27 DIAGNOSIS — R772 Abnormality of alphafetoprotein: Secondary | ICD-10-CM

## 2020-08-27 LAB — ALPHA FETOPROTEIN, MATERNAL
AFP MoM: POSITIVE — AB
AFP, Serum: 220.5 ng/mL
Calc'd Gestational Age: 16.3 weeks
Maternal Wt: 232 [lb_av]
Risk for ONTD: >1
Twins-AFP: 1

## 2020-08-27 LAB — TIQ-AOE

## 2020-08-27 NOTE — Progress Notes (Signed)
Referral to MFM genetic counseling placed per Steward Drone, CNM.

## 2020-08-28 ENCOUNTER — Telehealth: Payer: Self-pay | Admitting: *Deleted

## 2020-08-28 NOTE — Telephone Encounter (Signed)
-----   Message from Kathie Dike, [place] sent at 08/27/2020  1:50 PM EDT ----- Lowella Curb,  I sent pt a message with results and I placed the referral to the MFM genetic counseling. Can you please call MFM to schedule the genetic counseling session? Hopefully on the same day as her anatomy scan that is scheduled for 4/6 at 1:45 pm.  Thank you ----- Message ----- From: Sharyon Cable, CNM Sent: 08/27/2020   1:43 PM EDT To: [facility]  Please call patient and notify of abnormal AFP screening. Please set patient up with genetic counseling with anatomy US

## 2020-08-28 NOTE — Telephone Encounter (Signed)
Not able to leave patient a message in reference to scheduling genetic counseling appointment with MFM.

## 2020-09-07 ENCOUNTER — Other Ambulatory Visit: Payer: Self-pay

## 2020-09-07 ENCOUNTER — Ambulatory Visit: Payer: Self-pay | Admitting: Genetic Counselor

## 2020-09-07 ENCOUNTER — Ambulatory Visit: Payer: Federal, State, Local not specified - PPO | Attending: Obstetrics and Gynecology | Admitting: Genetic Counselor

## 2020-09-07 DIAGNOSIS — Z315 Encounter for genetic counseling: Secondary | ICD-10-CM | POA: Diagnosis not present

## 2020-09-07 DIAGNOSIS — R772 Abnormality of alphafetoprotein: Secondary | ICD-10-CM

## 2020-09-07 DIAGNOSIS — O281 Abnormal biochemical finding on antenatal screening of mother: Secondary | ICD-10-CM | POA: Diagnosis not present

## 2020-09-07 NOTE — Progress Notes (Signed)
09/07/2020  Leah Cervantes 11/10/2000 MRN: [id number] DOV: 09/07/2020  Leah Cervantes presented to the [facility] for a genetics consultation regarding her increased MS-AFP result. Leah Cervantes was accompanied to her appointment by her partner, Leah Cervantes.   Indication for genetic counseling - Increased MS-AFP (8.27 MoM)  Prenatal history  Leah Cervantes is a G45P0, 20 y.o. female. Her current pregnancy has completed [redacted]w[redacted]d (Estimated Date of Delivery: 01/31/21).  Leah Cervantes denied exposure to environmental toxins or chemical agents. She denied the use of alcohol, tobacco or Cervantes drugs. She reported taking prenatal vitamins, Diclegis, and Duloxetine. She denied significant viral illnesses and fevers during the course of her pregnancy. She had a subchorionic hemorrhage around 5-6 weeks' gestation. She has a personal history of anxiety, depression, and Raynaud's syndrome. Her medical and surgical histories were otherwise noncontributory.  Family History  A three generation pedigree was drafted and reviewed. The family history is remarkable for the following:  - Leah Cervantes's mother is deaf. She reportedly had recurrent ear infections in infancy. We discussed that there are several possible explanations for hearing loss, including genetic conditions or errors in embryonal/fetal development. Hearing loss may also be conductive, occurring due to blockages, ear infections, perforated or ruptured eardrums, cysts and tumors, foreign objects in the ear, and physical changes of the ear. It is possible that Leah Cervantes's mother's history of ear infections contributed to her deafness. However, without knowing if there is a genetic cause for hearing loss in the family, risk assessment is limited.  - Leah Cervantes's maternal half sister has a history of recurrent miscarriages. She had five miscarriages, then a healthy son, then miscarried a twin pregnancy recently. We reviewed that there are  many potential causes of recurrent miscarriages, including anatomic, immunologic, endocrine, and genetic factors. However, a cause is only identified in 50% of individuals who experience recurrent miscarriages. We discussed that 2-5% of individuals who experience multiple miscarriages carry a balanced chromosomal rearrangement that can become unbalanced and potentially lethal in their offspring. In addition to chromosomal abnormalities, certain single gene, X-linked, and polygenic/multifactorial disorders are associated with recurrent miscarriage. Ms. Addison was informed if she were interested, her sister could have an evaluation with a prenatal genetic counselor.  - Leah Cervantes's maternal great grandmother had a son with spina bifida who passed away. We discussed that many forms of spina bifida are also multifactorial in nature. Rarely, individuals with open neural tube defects (ONTDs) such as spina bifida have an underlying chromosome condition; however, individuals with these conditions often have other features in addition to spina bifida. Leah Cervantes was counseled that if her relative's spina bifida was multifactorial in nature, Leah Cervantes's risk to have a child with an ONTD would likely not be greatly elevated over general population risk. However, Leah Cervantes had an elevated MS-AFP level on routine screening. See Discussion section for more details.   - Leah Cervantes had a prior son who was stillborn. He and the mother of the baby were separated at the time and he was working as a IT trainer, so he received the news very suddenly and unexpectedly while on the road. Unfortunately, he was never given a reason for why the stillbirth occurred.  The remaining family histories were reviewed and found to be noncontributory for birth defects, intellectual disability, recurrent pregnancy loss, and known genetic conditions.    The patient's ancestry is [place] and Native [address] ([place]). The father of the  pregnancy's ancestry is [place], [place]  American, and Native American ([place]). Ashkenazi Jewish ancestry and consanguinity were denied. Pedigree will be scanned under Media.  Discussion  Increased MS-AFP:  Leah Cervantes was referred to genetic counseling as an increased risk for an open neural tube defect (ONTD) was identified on MS-AFP screening. Results of the screen indicated that the current fetus has a 1 in 10 (10%) risk to be affected with an ONTD such as spina bifida.   We reviewed that alpha-fetoprotein (AFP) is a protein made by the fetus's liver. During fetal development, some AFP passes through the placenta and into the mother's bloodstream. Too much or too little AFP may be a sign of a birth defect or other condition. Typically, MS-AFP levels of 1.0-2.5 MoM are considered normal. MS-AFP levels >2.5 are considered elevated.  We discussed that there are several possible explanations for elevated AFP results, including twin pregnancies, dating errors, ONTDs such as anencephaly or spina bifida, abdominal wall defects, placental abnormalities, fetal growth restriction, adverse obstetrical outcomes, or a normal variant. Leah Cervantes's dating is based on an early first trimester ultrasound and a singleton pregnancy was identified at that time, ruling out a twin pregnancy or incorrect dating as possible explanations. We reviewed other possible causes in greater detail.  Spina bifida:  One possibility is that the fetus has an ONTD such as anencephaly or spina bifida. Leah Cervantes and her partner had done some research on spina bifida. We reviewed that spina bifida refers to an opening in the spinal cord that most commonly occurs in the lower back but can occur anywhere along the spine. Spina bifida occurs due to a failure of the neural tube to close 2-3 weeks after conception. The neural tube is the precursor to the central nervous system (brain and spinal cord) in an embryo. Fetuses with spina bifida  have defects in the vertebrae around the spinal cord, the meninges (membrane covering the spinal cord), and the spinal cord itself. This causes symptoms such as difficulties moving the legs and hips, bladder and/or bowel incontinence, and sexual dysfunction. Other common findings associated with spina bifida include Chiari malformations and hydrocephalus. The exact symptoms an individual has as well as their prognosis depends on the level of their spinal lesion.  We reviewed that the cause of spina bifida is often unknown. Approximately 2-15% of cases of spina bifida are due to a purely genetic cause. Most commonly, environmental and genetic factors both play a role in the development of spina bifida. Certain environmental factors are known to increase the chances of an ONTD, including maternal diabetes, maternal use of medications such as anticonvulsants, maternal obesity, maternal fevers or exposure to high temperatures, and low levels of folate/vitamin B12. However, these factors alone are not sufficient to cause spina bifida. Most babies with spina bifida are born to women with no risk factors.  We discussed that surgical repair of a spinal lesion is possible in the prenatal or postnatal period. Postnatal repair generally occurs 24-48 hours after birth. Another surgery to place a shunt for hydrocephalus may be required to prevent brain injury. There is also fetal surgery for spina bifida, which [facility] for patients who meet eligibility criteria.  We reviewed that most commonly, MS-AFP values associated with spina bifida have an MoM of 3.3-3.8. The median MoM value for anencephaly is higher, ~7.7 MoM (Haddow et al., 1990). Approximately 90-95% of cases of spina bifida and >99% of causes of anencephaly can be detected on second trimester anatomy ultrasound Sheria Lang(Cameron &  Berneda Rose, 2009). The couple was informed that prenatal findings associated with spina bifida include ventriculomegaly,  hydrocephalus, lemon sign (abnormal shape of the cranium with narrowing of frontal bones), banana sign (abnormally shaped cerebellum), Chiari malformation, scoliosis, and clubfoot.  Abdominal wall defects:  We also reviewed that increased MS-AFP can be associated with abdominal wall defects such as omphalocele or gastroschisis. Omphalocele refers to an abdominal wall defect in which a fetus's intestines or other abdominal organs protrude outside of the body due to a hole at the base of the umbilical cord. The abdominal organs are covered only by a thin layer of tissue. Gastroschisis refers to an abdominal wall defect in which a fetus's intestines or other abdominal organs protrude outside of the body but have no covering membrane. Anatomy ultrasound is able to detect abdominal wall defects with >90% sensitivity.  Other possible explanations:   We reviewed the other possible explanations for an elevated MS-AFP result, including placental abnormalities, fetal growth restriction, and adverse obstetrical outcomes such as oligohydramnios, placental abruption, intrauterine fetal demise/stillbirth, preterm delivery, premature rupture of membranes, or preeclampsia. There also have been reports of an association between first trimester vaginal bleeding and increased MS-AFP, which Ms. Storr did report. Maternal cigarette use can also be associated with unexplained elevated MS-AFP concentrations (Heinonen et al., 1999). Ms. Servais informed me that she used to smoke half a pack of cigarettes per day, but had cut back to one cigarette every 4-5 days during the pregnancy. Her cigarette use has increased lately after receiving this stressful result.  The couple was counseled that the pregnancy will be monitored more closely for fetal anomalies, fetal growth restriction, preeclampsia, and other possible adverse obstetrical outcomes given Ms. Otero's increased MS-AFP result. They understand that MS-AFP is a screen only  and is not diagnostic. A positive MS-AFP result does not mean that the fetus is affected, but that there is sufficient risk to warrant further studies, such as ultrasounds or diagnostic testing.  Screening/testing options:  Ms. Grandt will have her anatomy ultrasound performed in Maternal Fetal Medicine this Wednesday. This ultrasound will assess for fetal/obstetrical abnormalities that can be associated with increased MS-AFP, including ONTDs, abdominal wall defects, placental abnormalities, or oligohydramnios. If spina bifida is suspected on ultrasound, the patient will likely be referred for additional imaging such as a fetal MRI.   We also reviewed the option of amniocentesis. We discussed the technical aspects of the procedure and quoted up to a 1 in 500 (0.2%) risk for spontaneous pregnancy loss or other adverse pregnancy outcomes as a result of amniocentesis. Cultured cells from an amniocentesis sample allow for the visualization of a fetal karyotype, which can detect >99% of large chromosomal aberrations. Chromosomal microarray can also be performed to identify smaller deletions or duplications of fetal chromosomal material. An amniocentesis with normal chromosomal analyses is required as one of the inclusion criteria for fetal myelomeningocele repair surgery.  Ms. Atilano was informed that analysis of AFP and acetylcholinesterase is possible on an amniocentesis sample. Elevated levels of AFP in amniotic fluid can be associated with neural tube defects or abdominal wall defects. Acetylcholinesterase (AChE) is a protein that is secreted by neurons of the central nervous system. AChE is only present in the amniotic fluid if a fetus has an open neural tube defect allowing it to leak from the fetus into the fluid. If AF-AFP levels are elevated, testing reflexes to AChE. If AChE is detected, it is considered diagnostic for an open neural tube defect such as spina  bifida in a fetus. This could provide  clarification as to whether or not the fetus has spina bifida. AF-AFP and AChE analysis can detect ONTDs with ~96-98% accuracy.   Ms. Eyman informed me that she may opt for amniocentesis if spina bifida is suspected on ultrasound and she is a possible candidate for fetal surgery. She would likely decline an amniocentesis if this is not the case. She understands that amniocentesis is available at any point after 16 weeks of pregnancy and that she may opt to undergo the procedure at any time.  Screening/testing results:  We also reviewed that Ms. Mireles had [facility] noninvasive prenatal screening (NIPS) through the laboratory [facility] that was low-risk for fetal aneuploidies. We reviewed that these results showed a less than 1 in 10,000 risk for trisomies 21, 18 and 13, and monosomy X (Turner syndrome).  In addition, the risk for triploidy and sex chromosome trisomies (47,XXX and 47,XXY) was also low. Ms. Rossano elected to have cfDNA analysis for 22q11.2 deletion syndrome, which was also low risk (1 in 12,000). We reviewed that while this testing identifies 94-99% of pregnancies with trisomy 16, trisomy 70 and trisomy 42 and >70% of sex chromosome aneuploidies, it is NOT diagnostic. A positive test result requires confirmation by CVS or amniocentesis, and a negative test result does not rule out a fetal chromosome abnormality. She also understands that this testing does not identify all genetic conditions.  Finally, we reviewed that Ms. Weidmann had Horizon carrier screening performed through [place], which was negative for 4 conditions. Thus, her risk to be a carrier for these 4 conditions (listed separately in the laboratory report) has been reduced but not eliminated. This puts her pregnancies at significantly decreased risk of being affected by one of these conditions.  Plan:  Ms. Bottomley will return to Maternal Fetal Medicine this Wednesday for her anatomy ultrasound. The couple will decide on whether or  not they wish to pursue more testing depending on results from this ultrasound.   Ms. Grieder disclosed that she had some unsafe coping mechanisms in the past, but that she has been feeling well-supported after receiving this result. Her partner's mother is a Nurse, learning disability and Ms. Tow has taken great solace in speaking with her. Mr. Corella is a firefighter and informed me that he has developed many healthy coping mechanisms through his experiences as a IT sales professional and after his traumatic stillbirth ten years ago. The couple indicated that many friends and family members have reached out to them to provide their support within the past few weeks, which is helpful but can also feel overwhelming. Ms. Renier identified healthy coping mechanisms that she enjoys, such as spending time outside and reading. I encouraged her to engage in these activities in the days leading up to her ultrasound, as I recognized that she will likely be feeling anxious until that time. I also offered to send her support resources and information on increased MS-AFP, which she declined at this time.  I counseled Ms. Joswick regarding the above risks and available options. The approximate face-to-face time with the genetic counselor was 60 minutes.  In summary:  Discussed MS-AFP result  1 in 10 (10%) risk for open neural tube defect due to elevated AFP (8.27 MoM)  Reviewed possible explanations for elevated MS-AFP level  Twin pregnancies, dating errors, ONTDs, abdominal wall defects, placental abnormalities, fetal growth restriction, adverse obstetrical outcomes, false positive result  Reviewed low-risk NIPS and negative Horizon-4 carrier screening results  Reduction in risk for  Down syndrome, trisomy 48, trisomy 95, triploidy, sex chromosome aneuploidies, and 22q11.2 deletion syndrome  Offered additional testing and screening  Will return to MFM for anatomy ultrasound on 4/6  May consider amniocentesis if there is concern  for ONTD on ultrasound  Reviewed family history concerns   Gershon Crane, MS, Aeronautical engineer

## 2020-09-09 ENCOUNTER — Other Ambulatory Visit: Payer: Self-pay | Admitting: Obstetrics

## 2020-09-09 ENCOUNTER — Other Ambulatory Visit: Payer: Self-pay

## 2020-09-09 ENCOUNTER — Telehealth: Payer: Self-pay | Admitting: Obstetrics & Gynecology

## 2020-09-09 ENCOUNTER — Encounter: Payer: Self-pay | Admitting: *Deleted

## 2020-09-09 ENCOUNTER — Ambulatory Visit (HOSPITAL_BASED_OUTPATIENT_CLINIC_OR_DEPARTMENT_OTHER): Payer: Federal, State, Local not specified - PPO

## 2020-09-09 ENCOUNTER — Other Ambulatory Visit: Payer: Self-pay | Admitting: Certified Nurse Midwife

## 2020-09-09 ENCOUNTER — Telehealth: Payer: Self-pay | Admitting: *Deleted

## 2020-09-09 ENCOUNTER — Ambulatory Visit: Payer: Federal, State, Local not specified - PPO | Admitting: *Deleted

## 2020-09-09 DIAGNOSIS — R772 Abnormality of alphafetoprotein: Secondary | ICD-10-CM

## 2020-09-09 DIAGNOSIS — Z3480 Encounter for supervision of other normal pregnancy, unspecified trimester: Secondary | ICD-10-CM

## 2020-09-09 DIAGNOSIS — O99210 Obesity complicating pregnancy, unspecified trimester: Secondary | ICD-10-CM

## 2020-09-09 DIAGNOSIS — O99212 Obesity complicating pregnancy, second trimester: Secondary | ICD-10-CM | POA: Diagnosis not present

## 2020-09-09 DIAGNOSIS — Z3A19 19 weeks gestation of pregnancy: Secondary | ICD-10-CM | POA: Diagnosis not present

## 2020-09-09 DIAGNOSIS — O280 Abnormal hematological finding on antenatal screening of mother: Secondary | ICD-10-CM

## 2020-09-09 DIAGNOSIS — E669 Obesity, unspecified: Secondary | ICD-10-CM

## 2020-09-09 DIAGNOSIS — Z20822 Contact with and (suspected) exposure to covid-19: Secondary | ICD-10-CM | POA: Diagnosis not present

## 2020-09-09 DIAGNOSIS — Z361 Encounter for antenatal screening for raised alphafetoprotein level: Secondary | ICD-10-CM | POA: Diagnosis not present

## 2020-09-09 DIAGNOSIS — O021 Missed abortion: Secondary | ICD-10-CM | POA: Diagnosis not present

## 2020-09-09 NOTE — Telephone Encounter (Signed)
Called patient to review plan for IUFD.  Discussed management options.  Pt desires some time at home first- plan to come to [facility] tomorrow morning.  Instructions reviewed and given precautions.  Pt voiced good understanding and will come in tomorrow.

## 2020-09-09 NOTE — Telephone Encounter (Signed)
Received a call from Dr Parke Poisson with MFM about pt.  She had an elevated AFP and had seen MFM for genetic counseling.  Today her U/S with MFM showed fetal demise.  Per Dr Parke Poisson fetus should be around [redacted]w[redacted]d but appeared to only measure around 15+ weeks.  I have contacted Dr Charlotta Newton who is the attending for our group and she is going to call the patient and discuss the results with her and what Plan of Care is next.

## 2020-09-09 NOTE — Progress Notes (Signed)
C/O "scant amt pinkish blood on tissue when wipes."

## 2020-09-10 ENCOUNTER — Encounter (HOSPITAL_COMMUNITY): Payer: Self-pay | Admitting: Obstetrics & Gynecology

## 2020-09-10 ENCOUNTER — Inpatient Hospital Stay (HOSPITAL_COMMUNITY)
Admission: AD | Admit: 2020-09-10 | Discharge: 2020-09-11 | DRG: 779 | Disposition: A | Discharge status: Home / Self Care | Payer: Federal, State, Local not specified - PPO | Attending: Obstetrics & Gynecology | Admitting: Obstetrics & Gynecology

## 2020-09-10 DIAGNOSIS — Z3A19 19 weeks gestation of pregnancy: Secondary | ICD-10-CM

## 2020-09-10 DIAGNOSIS — Z8759 Personal history of other complications of pregnancy, childbirth and the puerperium: Secondary | ICD-10-CM | POA: Diagnosis present

## 2020-09-10 DIAGNOSIS — Z20822 Contact with and (suspected) exposure to covid-19: Secondary | ICD-10-CM | POA: Diagnosis present

## 2020-09-10 DIAGNOSIS — O021 Missed abortion: Principal | ICD-10-CM | POA: Diagnosis present

## 2020-09-10 DIAGNOSIS — O364XX0 Maternal care for intrauterine death, not applicable or unspecified: Secondary | ICD-10-CM | POA: Diagnosis present

## 2020-09-10 LAB — CBC
HCT: 35.8 % — ABNORMAL LOW (ref 36.0–46.0)
Hemoglobin: 12.3 g/dL (ref 12.0–15.0)
MCH: 31.5 pg (ref 26.0–34.0)
MCHC: 34.4 g/dL (ref 30.0–36.0)
MCV: 91.6 fL (ref 80.0–100.0)
Platelets: 253 10*3/uL (ref 150–400)
RBC: 3.91 MIL/uL (ref 3.87–5.11)
RDW: 12.9 % (ref 11.5–15.5)
WBC: 8.7 10*3/uL (ref 4.0–10.5)
nRBC: 0 % (ref 0.0–0.2)

## 2020-09-10 LAB — TYPE AND SCREEN
ABO/RH(D): O POS
Antibody Screen: NEGATIVE

## 2020-09-10 LAB — SARS CORONAVIRUS 2 (TAT 6-24 HRS): SARS Coronavirus 2: NEGATIVE

## 2020-09-10 LAB — RPR: RPR Ser Ql: NONREACTIVE

## 2020-09-10 MED ORDER — MISOPROSTOL 200 MCG PO TABS
400.0000 ug | ORAL_TABLET | ORAL | Status: DC | PRN
  Administered 2020-09-10 (×2): 400 ug via VAGINAL
  Filled 2020-09-10 (×2): qty 2

## 2020-09-10 MED ORDER — HYDROXYZINE HCL 50 MG PO TABS: 50.0000 mg | ORAL_TABLET | Freq: Four times a day (QID) | ORAL | Status: DC | PRN

## 2020-09-10 MED ORDER — MISOPROSTOL 200 MCG PO TABS
800.0000 ug | ORAL_TABLET | Freq: Once | ORAL | Status: AC
  Administered 2020-09-10: 800 ug via VAGINAL
  Filled 2020-09-10: qty 4

## 2020-09-10 MED ORDER — ONDANSETRON HCL 4 MG/2ML IJ SOLN: 4.0000 mg | Freq: Four times a day (QID) | INTRAMUSCULAR | Status: DC | PRN

## 2020-09-10 MED ORDER — ACETAMINOPHEN 325 MG PO TABS: 650.0000 mg | ORAL_TABLET | ORAL | Status: DC | PRN

## 2020-09-10 MED ORDER — SOD CITRATE-CITRIC ACID 500-334 MG/5ML PO SOLN: 30.0000 mL | ORAL | Status: DC | PRN

## 2020-09-10 MED ORDER — LACTATED RINGERS IV SOLN: 500.0000 mL | INTRAVENOUS | Status: DC | PRN

## 2020-09-10 MED ORDER — MISOPROSTOL 200 MCG PO TABS
200.0000 ug | ORAL_TABLET | Freq: Once | ORAL | Status: AC
  Administered 2020-09-10: 200 ug via VAGINAL
  Filled 2020-09-10: qty 1

## 2020-09-10 MED ORDER — LIDOCAINE HCL (PF) 1 % IJ SOLN: 30.0000 mL | INTRAMUSCULAR | Status: DC | PRN

## 2020-09-10 MED ORDER — LACTATED RINGERS IV SOLN
INTRAVENOUS | Status: DC
  Administered 2020-09-10: 125 mL/h via INTRAVENOUS

## 2020-09-10 MED ORDER — FENTANYL CITRATE (PF) 100 MCG/2ML IJ SOLN
50.0000 ug | INTRAMUSCULAR | Status: DC | PRN
  Administered 2020-09-10 (×5): 100 ug via INTRAVENOUS
  Administered 2020-09-10: 50 ug via INTRAVENOUS
  Administered 2020-09-10: 100 ug via INTRAVENOUS
  Filled 2020-09-10 (×9): qty 2

## 2020-09-10 NOTE — Progress Notes (Signed)
800 mcg Cytotec inserted in the vagina. Carmelina Dane, RN

## 2020-09-10 NOTE — Plan of Care (Signed)
  Problem: Education: Goal: Knowledge of General Education information will improve Description: Including pain rating scale, medication(s)/side effects and non-pharmacologic comfort measures Outcome: Completed/Met   Problem: Coping: Goal: Level of anxiety will decrease Outcome: Completed/Met   Problem: Elimination: Goal: Will not experience complications related to urinary retention Outcome: Completed/Met

## 2020-09-10 NOTE — Progress Notes (Signed)
FACULTY PRACTICE ANTEPARTUM ADMISSION HISTORY AND PHYSICAL NOTE   History of Present Illness: Leah Cervantes is a 20 y.o. G1P0 at [redacted]w[redacted]d admitted for fetal demise.  Pt has noted some light vaginal spotting the past couple of days.  Denies pelvic or abdominal pain.  No fever or chills.  No other acute complaints  Patient Active Problem List   Diagnosis Date Noted  . Fetal demise affecting delivery 09/10/2020  . Abnormal antenatal AFP screen 08/27/2020  . Obesity during pregnancy 08/18/2020  . Rubella non-immune status, antepartum 07/27/2020  . Lumbar radiculopathy 03/26/2020  . MDD (major depressive disorder), recurrent episode, severe (HCC) 11/09/2018  . Depression, recurrent (HCC) 08/26/2016  . GAD (generalized anxiety disorder) 08/26/2016  . Raynaud's disease without gangrene 06/13/2016    Past Medical History:  Diagnosis Date  . Anxiety   . Depression     Past Surgical History:  Procedure Laterality Date  . NO PAST SURGERIES      OB History  Gravida Para Term Preterm AB Living  1            SAB IAB Ectopic Multiple Live Births               # Outcome Date GA Lbr Len/2nd Weight Sex Delivery Anes PTL Lv  1 Current             Social History   Socioeconomic History  . Marital status: Single    Spouse name: Not on file  . Number of children: Not on file  . Years of education: Not on file  . Highest education level: Not on file  Occupational History  . Not on file  Tobacco Use  . Smoking status: Former Smoker    Packs/day: 0.35  . Smokeless tobacco: Never Used  Vaping Use  . Vaping Use: Never used  Substance and Sexual Activity  . Alcohol use: Not Currently    Comment: occasional/very rare  . Drug use: No  . Sexual activity: Yes    Birth control/protection: None  Other Topics Concern  . Not on file  Social History Narrative  . Not on file   Social Determinants of Health   Financial Resource Strain: Not on file  Food Insecurity: Not on file   Transportation Needs: Not on file  Physical Activity: Not on file  Stress: Not on file  Social Connections: Not on file    Family History  Problem Relation Age of Onset  . Hearing loss Mother   . Hypertension Mother   . Fibromyalgia Sister   . Stroke Maternal Grandmother   . Heart disease Maternal Grandmother   . Hypertension Maternal Grandmother   . Fibromyalgia Maternal Grandfather   . Raynaud syndrome Paternal Grandmother   . Heart attack Paternal Grandfather   . Bipolar disorder Maternal Uncle   . Depression Father     Allergies  Allergen Reactions  . Prednisone Hives    Medications Prior to Admission  Medication Sig Dispense Refill Last Dose  . acetaminophen (TYLENOL) 325 MG tablet Take 650 mg by mouth every 6 (six) hours as needed.     Marland Kitchen aspirin EC 81 MG tablet Take 1 tablet (81 mg total) by mouth daily. Swallow whole. (Patient not taking: No sig reported) 30 tablet 6   . doxylamine, Sleep, (WAL-SOM) 25 MG tablet Take 25 mg by mouth at bedtime. (Patient not taking: No sig reported)     . DULoxetine (CYMBALTA) 60 MG capsule Take 1 capsule (60 mg total)  by mouth 2 (two) times daily. 60 capsule 3   . ondansetron (ZOFRAN ODT) 8 MG disintegrating tablet Take 1 tablet (8 mg total) by mouth every 8 (eight) hours as needed for nausea or vomiting. (Patient not taking: Reported on 09/09/2020) 20 tablet 6   . Prenatal Vit-Fe Fumarate-FA (PRENATAL VITAMINS) 28-0.8 MG TABS Take 1 tablet by mouth daily. 30 tablet 5   . promethazine (PHENERGAN) 25 MG tablet Take 1 tablet (25 mg total) by mouth every 6 (six) hours as needed for nausea or vomiting. (Patient not taking: Reported on 09/09/2020) 30 tablet 1     Review of Systems - General ROS: negative Psychological ROS: positive for - anxiety and depression Ophthalmic ROS: negative Endocrine ROS: negative Breast ROS: negative Respiratory ROS: no cough, shortness of breath, or wheezing Cardiovascular ROS: no chest pain or dyspnea on  exertion Gastrointestinal ROS: negative Genito-Urinary ROS: no dysuria, trouble voiding, or hematuria Musculoskeletal ROS: negative Dermatological ROS: negative  Vitals:  LMP 04/26/2020  Physical Examination: CONSTITUTIONAL: Well-developed, well-nourished female in no acute distress.  SKIN: Skin is warm and dry. No rash noted. Not diaphoretic NEUROLGIC: Alert and oriented to person, place, and time PSYCHIATRIC: Normal mood and affect. Normal behavior. Normal judgment and thought content. CARDIOVASCULAR: Normal heart rate noted, regular rhythm RESPIRATORY: Effort and breath sounds normal, no problems with respiration noted, CTAB ABDOMEN: Soft, nontender, nondistended, uterus below umbilicus MUSCULOSKELETAL: Normal range of motion. No edema and no tenderness  Labs: CBC, T&S to be collected  Imaging Studies: [place] MFM OB COMP + 14 WK  Result Date: 09/09/2020 ----------------------------------------------------------------------  OBSTETRICS REPORT                       (Signed Final 09/09/2020 03:31 pm) ---------------------------------------------------------------------- Patient Info  ID #:       [id number]                          D.O.B.:  2001/02/15 (20 yrs)  Name:       Leah Cervantes                Visit Date: 09/09/2020 01:49 pm ---------------------------------------------------------------------- Performed By  Attending:        Ma Rings MD         Ref. Address:     Faculty  Performed By:     Fayne Norrie BS,      [facility]                                                             [facility]  Referred By:      Sharyon CableVERONICA C                    ROGERS CNM ---------------------------------------------------------------------- Orders  #  Description                           Code        Ordered By  1  US MFM OB COMP + 14 WK                 76805.01    YU FANG ----------------------------------------------------------------------  #  Order #                     Accession #                Episode #  1  [id number]                   [id number]                 [id number] ---------------------------------------------------------------------- Indications  Antenatal screening for elevated               Z36.1  alphafetoprotein level (AFP) (8.27)  [redacted] weeks gestation of pregnancy                Z3A.19  Obesity complicating pregnancy, second         O99.212  trimester ---------------------------------------------------------------------- Fetal Evaluation  Num Of Fetuses:         1  Cardiac Activity:       Not visualized  Placenta:               Posterior; hydropic  Amniotic Fluid  AFI FV:      Subjectively decreased                              Largest Pocket(cm)                              3.34 ---------------------------------------------------------------------- Biometry  BPD:      25.3  mm     G. Age:  14w 3d        < 1  %                                                          FL/BPD:     68.0   %  FL:       17.2  mm     G. Age:  15w 1d        < 1  % ---------------------------------------------------------------------- OB History  Gravidity:    1 ---------------------------------------------------------------------- Gestational Age  LMP:           19w 3d        Date:  04/26/20                 EDD:   01/31/21  U/S Today:     14w 6d  EDD:   03/04/21  Best:          19w 3d     Det. By:  LMP  (04/26/20)          EDD:   01/31/21 ---------------------------------------------------------------------- Comments  This patient was seen as her MSAFP was extremely elevated  (8.27 MoM).  She had a cell free DNA test drawn earlier in  her pregnancy that indicated a low risk for trisomy 41, 29, and  13.  She reports that she started to have a small amount of  bright red vaginal bleeding yesterday.  On today's exam, a fetal  demise is noted.  The demise most  likely occurred a while ago as postmortem changes were  noted in the fetus.  The fetal biometry measurements were  difficult to obtain as the fetus was curled up.  However, the  BPD and femur length measurements indicate that the fetus  was most likely between 14 to 15 weeks when the demise  occurred.  A large hydropic placenta is noted today.  The patient and her partner were advised regarding the fetal  demise.  They were reassured that there was nothing that  they did nor anyone else did to have caused this event.  They  were advised that abnormal placental function as indicated by  her extremely elevated MSAFP level most likely contributed  to the miscarriage.  They were reassured that most women  who have had a miscarriage in a prior pregnancy will have a  normal healthy pregnancy in the future.  The management of a fetal demise was discussed with the  patient and her partner today.  The risks versus benefits of an  induction of labor versus a D&E procedure were discussed.  They could not make a decision today regarding the type of  procedure they would like to have.  They understand that  should they choose the D&E procedure and a provider is  unavailable at [facility], she may have to be referred to  an outside institution for the D&E.  The products of  conception should be sent for an [facility] test.  For her future pregnancy, she should have an ultrasound  scheduled at around 10 to 11 weeks followed by an  ultrasound at around 16 weeks and a detailed anatomy scan  at around 19 weeks.  She was advised that hopefully with  more frequent ultrasounds in her next pregnancy, we can  reassure her that things are progressing well. ----------------------------------------------------------------------                   Ma Rings, MD Electronically Signed Final Report   09/09/2020 03:31 pm ----------------------------------------------------------------------  Bedside US  completed- confirmed fetal demise  Assessment and Plan: 20yo G1P0 @ [redacted]w[redacted]d admitted for fetal demise  -Admit to Antenatal -plan for induction with cytotec- once then per vagina every 4hr up to 5 total doses -CBC, T&S ordered -desires genetic/ANORA testing -LR @ 125cc/hr  Myna Hidalgo, DO Attending Obstetrician & Gynecologist, Faculty Practice Center for [employer], [facility]

## 2020-09-11 ENCOUNTER — Encounter (HOSPITAL_COMMUNITY): Payer: Self-pay | Admitting: *Deleted

## 2020-09-11 LAB — CBC
HCT: 40.8 % (ref 36.0–46.0)
Hemoglobin: 14.1 g/dL (ref 12.0–15.0)
MCH: 32.3 pg (ref 26.0–34.0)
MCHC: 34.6 g/dL (ref 30.0–36.0)
MCV: 93.4 fL (ref 80.0–100.0)
Platelets: 305 10*3/uL (ref 150–400)
RBC: 4.37 MIL/uL (ref 3.87–5.11)
RDW: 12.8 % (ref 11.5–15.5)
WBC: 14.1 10*3/uL — ABNORMAL HIGH (ref 4.0–10.5)
nRBC: 0 % (ref 0.0–0.2)

## 2020-09-11 MED ORDER — ACETAMINOPHEN 325 MG PO TABS: 650.0000 mg | ORAL_TABLET | ORAL | Status: DC | PRN

## 2020-09-11 MED ORDER — IBUPROFEN 600 MG PO TABS
600.0000 mg | ORAL_TABLET | Freq: Four times a day (QID) | ORAL | 0 refills | Status: DC | PRN
Start: 2020-09-11 — End: 2021-02-16

## 2020-09-11 MED ORDER — ONDANSETRON HCL 4 MG/2ML IJ SOLN: 4.0000 mg | INTRAMUSCULAR | Status: DC | PRN

## 2020-09-11 MED ORDER — ONDANSETRON HCL 4 MG PO TABS: 4.0000 mg | ORAL_TABLET | ORAL | Status: DC | PRN

## 2020-09-11 MED ORDER — DIPHENHYDRAMINE HCL 25 MG PO CAPS: 25.0000 mg | ORAL_CAPSULE | Freq: Four times a day (QID) | ORAL | Status: DC | PRN

## 2020-09-11 MED ORDER — DIBUCAINE (PERIANAL) 1 % EX OINT: 1.0000 "application " | TOPICAL_OINTMENT | CUTANEOUS | Status: DC | PRN

## 2020-09-11 MED ORDER — WITCH HAZEL-GLYCERIN EX PADS: 1.0000 "application " | MEDICATED_PAD | CUTANEOUS | Status: DC | PRN

## 2020-09-11 MED ORDER — SIMETHICONE 80 MG PO CHEW: 80.0000 mg | CHEWABLE_TABLET | ORAL | Status: DC | PRN

## 2020-09-11 MED ORDER — BENZOCAINE-MENTHOL 20-0.5 % EX AERO: 1.0000 "application " | INHALATION_SPRAY | CUTANEOUS | Status: DC | PRN

## 2020-09-11 MED ORDER — ZOLPIDEM TARTRATE 5 MG PO TABS: 5.0000 mg | ORAL_TABLET | Freq: Every evening | ORAL | Status: DC | PRN

## 2020-09-11 MED ORDER — SENNOSIDES-DOCUSATE SODIUM 8.6-50 MG PO TABS: 2.0000 | ORAL_TABLET | Freq: Every day | ORAL | Status: DC

## 2020-09-11 MED ORDER — NORELGESTROMIN-ETH ESTRADIOL 150-35 MCG/24HR TD PTWK
1.0000 | MEDICATED_PATCH | TRANSDERMAL | 12 refills | Status: DC
Start: 2020-09-11 — End: 2021-02-16

## 2020-09-11 MED ORDER — COCONUT OIL OIL: 1.0000 "application " | TOPICAL_OIL | Status: DC | PRN

## 2020-09-11 MED ORDER — TETANUS-DIPHTH-ACELL PERTUSSIS 5-2.5-18.5 LF-MCG/0.5 IM SUSY: 0.5000 mL | PREFILLED_SYRINGE | Freq: Once | INTRAMUSCULAR | Status: DC

## 2020-09-11 MED ORDER — IBUPROFEN 600 MG PO TABS
600.0000 mg | ORAL_TABLET | Freq: Four times a day (QID) | ORAL | Status: DC
  Administered 2020-09-11 (×3): 600 mg via ORAL
  Filled 2020-09-11 (×3): qty 1

## 2020-09-11 NOTE — Progress Notes (Signed)
POSTPARTUM PROGRESS NOTE  PPD#1  Subjective:  Leah Cervantes is a 20 y.o. G1P0 s/p SVD at [redacted]w[redacted]d due to fetal demise. Today she notes no acute complaints. She denies any problems with ambulating, voiding or po intake. Denies nausea or vomiting. Pain controlled.  Minimal bleeding.  Denies SOB or Chest pain.  Denies fevers  Objective: Blood pressure (!) 114/52, pulse 82, temperature 98.2 F (36.8 C), temperature source Oral, resp. rate 18, height 5\' 8"  (1.727 m), weight 107 kg, last menstrual period 04/26/2020, SpO2 97 %.  Physical Exam:  General: alert, cooperative and no distress Chest: no respiratory distress, CTAB Heart: regular rate and rhythm Abdomen: soft, nontender Uterine Fundus: firm, non-tender DVT Evaluation: No calf swelling or tenderness Extremities: no edema Skin: warm, dry  Results for orders placed or performed during the hospital encounter of 09/10/20 (from the past 24 hour(s))  CBC     Status: Abnormal   Collection Time: 09/10/20 12:06 PM  Result Value Ref Range   WBC 8.7 4.0 - 10.5 K/uL   RBC 3.91 3.87 - 5.11 MIL/uL   Hemoglobin 12.3 12.0 - 15.0 g/dL   HCT 11/10/20 (L) 70.7 - 86.7 %   MCV 91.6 80.0 - 100.0 fL   MCH 31.5 26.0 - 34.0 pg   MCHC 34.4 30.0 - 36.0 g/dL   RDW 54.4 92.0 - 10.0 %   Platelets 253 150 - 400 K/uL   nRBC 0.0 0.0 - 0.2 %  Type and screen [facility]     Status: None   Collection Time: 09/10/20 12:06 PM  Result Value Ref Range   ABO/RH(D) O POS    Antibody Screen NEG    Sample Expiration      09/13/2020,2359 Performed at [facility], [address] [place], [place]   RPR     Status: None   Collection Time: 09/10/20 12:06 PM  Result Value Ref Range   RPR Ser Ql NON REACTIVE NON REACTIVE  SARS CORONAVIRUS 2 (TAT 6-24 HRS) Nasopharyngeal Nasopharyngeal Swab     Status: None   Collection Time: 09/10/20  1:10 PM   Specimen: Nasopharyngeal Swab  Result Value Ref Range   SARS Coronavirus 2 NEGATIVE  NEGATIVE  CBC     Status: Abnormal   Collection Time: 09/11/20  5:21 AM  Result Value Ref Range   WBC 14.1 (H) 4.0 - 10.5 K/uL   RBC 4.37 3.87 - 5.11 MIL/uL   Hemoglobin 14.1 12.0 - 15.0 g/dL   HCT 11/11/20 83.2 - 54.9 %   MCV 93.4 80.0 - 100.0 fL   MCH 32.3 26.0 - 34.0 pg   MCHC 34.6 30.0 - 36.0 g/dL   RDW 82.6 41.5 - 83.0 %   Platelets 305 150 - 400 K/uL   nRBC 0.0 0.0 - 0.2 %    Assessment/Plan: Leah Cervantes is a 20 y.o. G1P0 s/p SVD at [redacted]w[redacted]d- fetal demise  -meeting postpartum milestones appropriately -ANORA collected -Depression  -pt continue with current medication  -advised to follow up with therapist who she has seen in the past  -emergency resources will be included on discharge -Discussed contraception-  OCP risk assessment: Pt denies personal history of VTE, stroke or heart attack.  Denies personal h/o breast cancer.  Does note tobacco use- under the age of 20yo.  Denies h/o migraines with aura.  Reviewed options- desires for trial of patch- will review with patient when she follows up as outpatient  -Plan for discharge  home later today   LOS: 1 day   Myna Hidalgo, DO Faculty Attending, Center for [facility] 09/11/2020, 9:40 AM

## 2020-09-11 NOTE — Plan of Care (Signed)
  Problem: Education: Goal: Knowledge of General Education information will improve Description: Including pain rating scale, medication(s)/side effects and non-pharmacologic comfort measures Outcome: Completed/Met   Problem: Coping: Goal: Level of anxiety will decrease Outcome: Completed/Met   Problem: Elimination: Goal: Will not experience complications related to urinary retention Outcome: Completed/Met   Problem: Education: Goal: Verbalization of understanding the information provided will improve Outcome: Completed/Met   Problem: Coping: Goal: Level of anxiety will decrease Outcome: Completed/Met   Problem: Activity: Goal: Will verbalize the importance of balancing activity with adequate rest periods Outcome: Completed/Met Goal: Ability to tolerate increased activity will improve Outcome: Completed/Met   Problem: Coping: Goal: Ability to identify and utilize available resources and services will improve Outcome: Completed/Met

## 2020-09-11 NOTE — Progress Notes (Signed)
Patient delivered at 2339 in bed.  Vital signs stable.  Patient did not want to see fetus nor did significant other.

## 2020-09-11 NOTE — Discharge Instructions (Addendum)
Managing Pregnancy Loss Pregnancy loss can happen any time during a pregnancy. Often the cause is not known. It is rarely because of anything you did. Pregnancy loss in early pregnancy (during the first trimester) is called a miscarriage. This type of pregnancy loss is the most common. Pregnancy loss that happens after 20 weeks of pregnancy is called fetal demise if the baby's heart stops beating before birth. Fetal demise is much less common. Some women experience spontaneous labor shortly after fetal demise resulting in a stillborn birth (stillbirth). Any pregnancy loss can be devastating. You will need to recover both physically and emotionally. Most women are able to get pregnant again after a pregnancy loss and deliver a healthy baby. How to manage emotional recovery Pregnancy loss is very hard emotionally. You may feel many different emotions while you grieve. You may feel sad and angry. You may also feel guilty. It is normal to have periods of crying. Emotional recovery can take longer than physical recovery. It is different for everyone. Taking these steps can help you in managing this loss:  Remember that it is unlikely you did anything to cause the pregnancy loss.  Share your thoughts and feelings with friends, family, and your partner. Remember that your partner is also recovering emotionally.  Make sure you have a good support system. Do not spend too much time alone.  Meet with a pregnancy loss counselor or join a pregnancy loss support group.  Get enough sleep and eat a healthy diet. Return to regular exercise when you have recovered physically.  Do not use drugs or alcohol to manage your emotions.  Consider seeing a mental health professional to help you recover emotionally.  Ask a friend or loved one to help you decide what to do with any clothing and nursery items you received for your baby. In the case of a stillbirth, many women benefit from taking additional steps in the  grieving process. You may want to:  Hold your baby after the birth.  Name your baby.  Request a birth certificate.  Create a keepsake such as handprints or footprints.  Dress your baby and have a picture taken.  Make funeral arrangements.  Ask for a baptism or blessing. Hospitals have staff members who can help you with all these arrangements.   How to recognize emotional stress It is normal to have emotional stress after a pregnancy loss. But emotional stress that lasts a long time or becomes severe requires treatment. Watch out for these signs of severe emotional stress:  Sadness, anger, or guilt that is not going away and is interfering with your normal activities.  Relationship problems that have occurred or gotten worse since the pregnancy loss.  Signs of depression that last longer than 2 weeks. These may include: ? Sadness. ? Anxiety. ? Hopelessness. ? Loss of interest in activities you enjoy. ? Inability to concentrate. ? Trouble sleeping or sleeping too much. ? Loss of appetite or overeating. ? Thoughts of death or of hurting yourself. Follow these instructions at home:  Take over-the-counter and prescription medicines only as told by your health care provider.  Nothing in the vagina x 4 weeks- no sex.  Ortho-Evra (patch) has been sent to your pharmacy for contraception.  Please wait 4 weeks before starting this medication.  For the first month of use, please use a back up method for contraception  Rest at home until your energy level returns. Return to your normal activities as told by your health care provider.  Ask your health care provider what activities are safe for you.  When you are ready, meet with your health care provider to discuss steps to take for a future pregnancy.  Keep all follow-up visits as told by your health care provider. This is important. Where to find support  To help you and your partner with the process of grieving, talk with your  health care provider or seek counseling.  Consider meeting with others who have experienced pregnancy loss. Ask your health care provider about support groups and resources. Where to find more information  U.S. Department of Health and Cytogeneticist on Women's Health: http://hoffman.com/  American Pregnancy Association: www.americanpregnancy.org Contact a health care provider if:  You continue to experience grief, sadness, or lack of motivation for everyday activities, and those feelings do not improve over time.  You are struggling to recover emotionally, especially if you are using alcohol or substances to help. Get help right away if:  You have thoughts of hurting yourself or others. If you ever feel like you may hurt yourself or others, or have thoughts about taking your own life, get help right away. You can go to your nearest emergency department or call:  Your local emergency services (911 in the [place]  A suicide crisis helpline, such as the National Suicide Prevention Lifeline at 641-805-4308. This is open 24 hours a day. Summary  Any pregnancy loss can be difficult physically and emotionally.  You may experience many different emotions while you grieve. Emotional recovery can last longer than physical recovery.  It is normal to have emotional stress after a pregnancy loss. But emotional stress that lasts a long time or becomes severe requires treatment.  See your health care provider if you are struggling emotionally after a pregnancy loss. This information is not intended to replace advice given to you by your health care provider. Make sure you discuss any questions you have with your health care provider. Document Revised: 09/12/2018 Document Reviewed: 08/03/2017 Elsevier Patient Education  2021 Elsevier Inc.  HELPFUL CONTACTS for dealing with Depression/Anxiety:  Phone First. [employer]- available 24/7:  Your local number  is: [phone] If you already have a service provider, call them first.  Have Support Come to You. Crisis situations are often best resolved at home. Mobile Crisis Teams are available 24  hours a day in all counties. Professional counselors will speak with you and your family  during a visit. They have an average response time of 2 hours. This service is provided  by: [facility] [phone]  Go To A Crisis Center. [facility] [address], [place], [place] [address] [phone] Monday -- Friday - 8:00 a.m. - 5:00 p.m.  [facility] [address] [place], [place] [address] [phone] [email or web address]  Additional Resources: TourneyLocator.com.cy.pdf National Suicide Scientist, research (physical sciences) or at [employer] 878 663 7169). https://www.rich.com/

## 2020-09-11 NOTE — Plan of Care (Signed)
  Problem: Health Behavior/Discharge Planning: Goal: Ability to manage health-related needs will improve Outcome: Adequate for Discharge   Problem: Clinical Measurements: Goal: Ability to maintain clinical measurements within normal limits will improve Outcome: Adequate for Discharge Goal: Will remain free from infection Outcome: Adequate for Discharge Goal: Diagnostic test results will improve Outcome: Adequate for Discharge Goal: Respiratory complications will improve Outcome: Adequate for Discharge Goal: Cardiovascular complication will be avoided Outcome: Adequate for Discharge   Problem: Activity: Goal: Risk for activity intolerance will decrease Outcome: Adequate for Discharge   Problem: Nutrition: Goal: Adequate nutrition will be maintained Outcome: Adequate for Discharge   Problem: Elimination: Goal: Will not experience complications related to bowel motility Outcome: Adequate for Discharge   Problem: Pain Managment: Goal: General experience of comfort will improve Outcome: Adequate for Discharge   Problem: Safety: Goal: Ability to remain free from injury will improve Outcome: Adequate for Discharge   Problem: Skin Integrity: Goal: Risk for impaired skin integrity will decrease Outcome: Adequate for Discharge   Problem: Coping: Goal: Ability to identify and utilize appropriate coping strategies will improve Outcome: Adequate for Discharge Goal: Ability to identify and utilize available resources and services will improve Outcome: Adequate for Discharge Goal: Will verbalize feelings Outcome: Adequate for Discharge   Problem: Education: Goal: Knowledge of condition will improve Outcome: Adequate for Discharge Goal: Individualized Educational Video(s) Outcome: Adequate for Discharge Goal: Individualized Newborn Educational Video(s) Outcome: Adequate for Discharge   Problem: Life Cycle: Goal: Chance of risk for complications during the postpartum period  will decrease Outcome: Adequate for Discharge   Problem: Role Relationship: Goal: Ability to demonstrate positive interaction with newborn will improve Outcome: Adequate for Discharge   Problem: Skin Integrity: Goal: Demonstration of wound healing without infection will improve Outcome: Adequate for Discharge   Problem: Education: Goal: Knowledge of disease or condition will improve Outcome: Adequate for Discharge Goal: Knowledge of the prescribed therapeutic regimen will improve Outcome: Adequate for Discharge   Problem: Coping: Goal: Ability to identify and utilize appropriate coping strategies will improve Outcome: Adequate for Discharge Goal: Ability to identify and utilize available support systems will improve Outcome: Adequate for Discharge Goal: Will verbalize feelings Outcome: Adequate for Discharge Goal: Decrease level of anxiety will Outcome: Adequate for Discharge   Problem: Clinical Measurements: Goal: Will show no signs and symptoms of excessive bleeding Outcome: Adequate for Discharge

## 2020-09-11 NOTE — Progress Notes (Signed)
Initial visit with Leah Cervantes and her husband to introduce spiritual care and offer support after the death of their son Leah Cervantes. Chaplain asked open ended questions and facilitated in the processing of emotions related to the loss of their son. Leah Cervantes reports it hasn't really hit her yet and the medication is managing her pain well. She expressed gratitude for the care she is receiving and said that they are leaning on their faith and she is focusing her energy on supporting her partner and her family.  FOB shared that he had a still birth 10 years ago and has coped well, but Leah Cervantes was initially worried about this new loss overwhelming him. He reports he has been able to compartmentalize well. Leah Cervantes shared that they decided not to see the baby after several of her visitors had seen him and she was able to sense that they were troubled.  They told her they thought it would be better if she didn't see Leah Cervantes, so she is finding comfort in imagining him while hugging the bear she was given. Chaplain provided education about the grief process and local resources for support including our hospital support group.   Please page as further needs arise.  Leah Cervantes. Leah Cervantes, M.Div. [facility] Chaplain Pager [phone] Office [phone]

## 2020-09-11 NOTE — Progress Notes (Signed)
CTSP after delivery of nonviable fetus at 2329.  Pt received 3 doses of cytotec.  Products of conception were delivered en caul.  Sac and fluid within were brownish in color.  Sac was opened and brownish fluid was noted.  Fetus was intact, but was obviously degenerated.  No retained POCs were suspected and maternal bleeding was minimal.  Sample of amniotic sac and placenta were taken for [facility] testing.  Placenta will be sent to pathology.  Cord was separated from fetus as the parents desire final disposition through a funeral home.

## 2020-09-12 NOTE — Discharge Summary (Signed)
Physician Discharge Summary  Patient ID: Leah Cervantes MRN: [id number] DOB/AGE: 21/20/2002 21 y.o.  Admit date: 09/10/2020 Discharge date: 09/11/2020  Admission Diagnoses: Fetal demise affecting pregnancy  Discharge Diagnoses:  Active Problems:   Fetal demise affecting delivery   Discharged Condition: stable  Hospital Course: 20yo G1P0@[redacted]w[redacted]d  admitted for induction due to fetal demise.  She was given vaginal cytotec and delivered on 4/7 evening without complications.  She was discharged home in stable condition on PPD#1.  Consults: None  Significant Diagnostic Studies: labs:  CBC Latest Ref Rng & Units 09/11/2020 09/10/2020 07/21/2020  WBC 4.0 - 10.5 K/uL 14.1(H) 8.7 5.7  Hemoglobin 12.0 - 15.0 g/dL 75.1 02.5 85.2  Hematocrit 36.0 - 46.0 % 40.8 35.8(L) 38.6  Platelets 150 - 400 K/uL 305 253 252    Treatments: IV hydration and cytotec  Discharge Exam: Blood pressure 136/73, pulse 100, temperature 99.1 F (37.3 C), temperature source Oral, resp. rate 17, height 5\' 8"  (1.727 m), weight 107 kg, last menstrual period 04/26/2020, SpO2 97 %, unknown if currently breastfeeding.  Physical Exam:  General: alert, cooperative and no distress Chest: no respiratory distress, CTAB Heart: regular rate and rhythm Abdomen: soft, nontender Uterine Fundus: firm, non-tender DVT Evaluation: No calf swelling or tenderness Extremities: no edema Skin: warm, dry  Disposition: Discharge disposition: 01-Home or Self Care       Discharge Instructions    Activity as tolerated   Complete by: As directed    Call MD for:  persistant dizziness or light-headedness   Complete by: As directed    Call MD for:  persistant nausea and vomiting   Complete by: As directed    Call MD for:  redness, tenderness, or signs of infection (pain, swelling, redness, odor or green/yellow discharge around incision site)   Complete by: As directed    Call MD for:  severe uncontrolled pain   Complete by: As directed     Call MD for:  temperature >100.4   Complete by: As directed    Sexual activity   Complete by: As directed    Nothing in the vagina/no sex for 4 weeks     Allergies as of 09/11/2020      Reactions   Prednisone Hives      Medication List    STOP taking these medications   aspirin EC 81 MG tablet   ondansetron 8 MG disintegrating tablet Commonly known as: Zofran ODT   promethazine 25 MG tablet Commonly known as: PHENERGAN   [place] 25 MG tablet Generic drug: doxylamine (Sleep)     TAKE these medications   acetaminophen 325 MG tablet Commonly known as: TYLENOL Take 650 mg by mouth every 6 (six) hours as needed.   DULoxetine 60 MG capsule Commonly known as: Cymbalta Take 1 capsule (60 mg total) by mouth 2 (two) times daily.   ibuprofen 600 MG tablet Commonly known as: ADVIL Take 1 tablet (600 mg total) by mouth every 6 (six) hours as needed for moderate pain or cramping.   norelgestromin-ethinyl estradiol 150-35 MCG/24HR transdermal patch Commonly known as: ORTHO EVRA Place 1 patch onto the skin once a week.   Prenatal Vitamins 28-0.8 MG Tabs Take 1 tablet by mouth daily.       Follow-up Information    Center for 11/11/2020 at [place]. Schedule an appointment as soon as possible for a visit in 2 week(s).   Specialty: Obstetrics and Gynecology Contact information: [address], [address] [place] [phone]  Signed: Sharon Seller 09/12/2020, 3:26 PM

## 2020-09-14 LAB — SURGICAL PATHOLOGY

## 2020-09-17 ENCOUNTER — Encounter: Payer: Federal, State, Local not specified - PPO | Admitting: Obstetrics and Gynecology

## 2020-09-23 ENCOUNTER — Encounter: Payer: Self-pay | Admitting: *Deleted

## 2020-10-13 ENCOUNTER — Ambulatory Visit: Payer: Federal, State, Local not specified - PPO

## 2020-10-27 ENCOUNTER — Other Ambulatory Visit: Payer: Self-pay

## 2020-11-28 ENCOUNTER — Other Ambulatory Visit: Payer: Self-pay | Admitting: Advanced Practice Midwife

## 2021-02-15 ENCOUNTER — Other Ambulatory Visit: Payer: Self-pay | Admitting: Family Medicine

## 2021-02-15 ENCOUNTER — Encounter: Payer: Self-pay | Admitting: Family Medicine

## 2021-02-16 MED ORDER — DULOXETINE HCL 60 MG PO CPEP: ORAL_CAPSULE | ORAL | 0 refills | Status: DC

## 2021-02-16 MED ORDER — DULOXETINE HCL 60 MG PO CPEP: 60.0000 mg | ORAL_CAPSULE | Freq: Two times a day (BID) | ORAL | 1 refills | Status: DC

## 2021-02-16 NOTE — Telephone Encounter (Signed)
Please call pt for f/u for cymbalta refill. 30 day supply sent

## 2021-02-16 NOTE — Telephone Encounter (Signed)
Patient has not been seen by Dr. Linford Arnold since 06/2020.  Please review and refill if appropriate.  Tiajuana Amass, CMA

## 2021-02-16 NOTE — Telephone Encounter (Signed)
Meds sent. Pls schedule f/u appt in about 1 mo

## 2021-02-17 NOTE — Telephone Encounter (Signed)
Called x1 and no voicemail set up. Will attempt again later. AM

## 2021-05-04 ENCOUNTER — Emergency Department (INDEPENDENT_AMBULATORY_CARE_PROVIDER_SITE_OTHER)
Admission: EM | Admit: 2021-05-04 | Discharge: 2021-05-04 | Disposition: A | Discharge status: Home / Self Care | Payer: Federal, State, Local not specified - PPO | Source: Home / Self Care

## 2021-05-04 DIAGNOSIS — H6001 Abscess of right external ear: Secondary | ICD-10-CM

## 2021-05-04 MED ORDER — SULFAMETHOXAZOLE-TRIMETHOPRIM 800-160 MG PO TABS: 1.0000 | ORAL_TABLET | Freq: Two times a day (BID) | ORAL | 0 refills | Status: AC

## 2021-05-04 NOTE — ED Triage Notes (Signed)
Pt presents with a rt "ear bump" that is painful to the tough. Pt states the bump has been there for 2-3 days.

## 2021-05-04 NOTE — ED Provider Notes (Signed)
[facility]    CSN: [id number] Arrival date & time: 05/04/21  1541      History   Chief Complaint Chief Complaint  Patient presents with   Otalgia    rt    HPI Leah Cervantes is a 20 y.o. female.   HPI 20 year old female presents with right ear lesion that is painful to touch.  Patient reports has been present for 2 to 3 days.  Past Medical History:  Diagnosis Date   Anxiety    Depression     Patient Active Problem List   Diagnosis Date Noted   Fetal demise affecting delivery 09/10/2020   Abnormal antenatal AFP screen 08/27/2020   Obesity during pregnancy 08/18/2020   Rubella non-immune status, antepartum 07/27/2020   Suicidal ideation    Supervision of other normal pregnancy, antepartum 06/23/2020   Lumbar radiculopathy 03/26/2020   MDD (major depressive disorder), recurrent episode, severe (HCC) 11/09/2018   Paresthesias 12/06/2017   Vestibular migraine 12/06/2017   Depression, recurrent (HCC) 08/26/2016   GAD (generalized anxiety disorder) 08/26/2016   Raynaud's disease without gangrene 06/13/2016    Past Surgical History:  Procedure Laterality Date   NO PAST SURGERIES      OB History     Gravida  1   Para      Term      Preterm      AB      Living         SAB      IAB      Ectopic      Multiple      Live Births               Home Medications    Prior to Admission medications   Medication Sig Start Date End Date Taking? Authorizing Provider  sulfamethoxazole-trimethoprim (BACTRIM DS) 800-160 MG tablet Take 1 tablet by mouth 2 (two) times daily for 7 days. 05/04/21 05/11/21 Yes Trevor Iha, FNP  DULoxetine (CYMBALTA) 60 MG capsule Take 1 capsule (60 mg total) by mouth 2 (two) times daily. Patient not taking: Reported on 05/04/2021 02/16/21   Agapito Games, MD  DULoxetine (CYMBALTA) 60 MG capsule TAKE 1 CAPSULE(60 MG) BY MOUTH TWICE DAILY. 1 month supply given.must schedule/keep appointment for refills  02/16/21   Agapito Games, MD    Family History Family History  Problem Relation Age of Onset   Hearing loss Mother    Hypertension Mother    Fibromyalgia Sister    Stroke Maternal Grandmother    Heart disease Maternal Grandmother    Hypertension Maternal Grandmother    Fibromyalgia Maternal Grandfather    Raynaud syndrome Paternal Grandmother    Heart attack Paternal Grandfather    Bipolar disorder Maternal Uncle    Depression Father     Social History Social History   Tobacco Use   Smoking status: Former    Packs/day: 0.35    Types: Cigarettes   Smokeless tobacco: Never  Vaping Use   Vaping Use: Never used  Substance Use Topics   Alcohol use: Not Currently    Comment: occasional/very rare   Drug use: No     Allergies   Prednisone   Review of Systems Review of Systems  HENT:  Positive for ear pain.     Physical Exam Triage Vital Signs ED Triage Vitals  Enc Vitals Group     BP 05/04/21 1645 108/76     Pulse Rate 05/04/21 1645 83  Resp 05/04/21 1645 12     Temp 05/04/21 1645 99.2 F (37.3 C)     Temp Source 05/04/21 1645 Oral     SpO2 05/04/21 1645 98 %     Weight --      Height --      Head Circumference --      Peak Flow --      Pain Score 05/04/21 1646 7     Pain Loc --      Pain Edu? --      Excl. in GC? --    No data found.  Updated Vital Signs BP 108/76 (BP Location: Left Arm)   Pulse 83   Temp 99.2 F (37.3 C) (Oral)   Resp 12   SpO2 98%      Physical Exam Vitals and nursing note reviewed.  Constitutional:      General: She is not in acute distress.    Appearance: Normal appearance. She is obese. She is not ill-appearing.  HENT:     Head: Normocephalic and atraumatic.     Right Ear: Tympanic membrane and ear canal normal.     Left Ear: Tympanic membrane, ear canal and external ear normal.     Ears:     Comments: Right distal EAC:~0.5 cm x 0.25 cm circular shaped erythematous abscess noted, TTP    Nose: Nose normal.      Mouth/Throat:     Mouth: Mucous membranes are moist.     Pharynx: Oropharynx is clear.  Eyes:     Extraocular Movements: Extraocular movements intact.     Conjunctiva/sclera: Conjunctivae normal.     Pupils: Pupils are equal, round, and reactive to light.  Cardiovascular:     Rate and Rhythm: Normal rate and regular rhythm.     Pulses: Normal pulses.     Heart sounds: Normal heart sounds.  Pulmonary:     Effort: Pulmonary effort is normal.     Breath sounds: Normal breath sounds.  Musculoskeletal:        General: Normal range of motion.     Cervical back: Normal range of motion and neck supple.  Skin:    General: Skin is warm and dry.  Neurological:     General: No focal deficit present.     Mental Status: She is alert and oriented to person, place, and time. Mental status is at baseline.     UC Treatments / Results  Labs (all labs ordered are listed, but only abnormal results are displayed) Labs Reviewed - No data to display  EKG   Radiology No results found.  Procedures Procedures (including critical care time)  Medications Ordered in UC Medications - No data to display  Initial Impression / Assessment and Plan / UC Course  I have reviewed the triage vital signs and the nursing notes.  Pertinent labs & imaging results that were available during my care of the patient were reviewed by me and considered in my medical decision making (see chart for details).     MDM: 1.  Abscess of ear canal right-Rx'd Bactrim. Advised patient to take medication as directed with food to completion.  Encouraged patient increase daily water intake while taking this medication.  Patient discharged home, hemodynamically stable. Final Clinical Impressions(s) / UC Diagnoses   Final diagnoses:  Abscess of ear canal, right     Discharge Instructions      Advised patient to take medication as directed with food to completion.  Encouraged patient increase daily  water intake while  taking this medication.     ED Prescriptions     Medication Sig Dispense Auth. Provider   sulfamethoxazole-trimethoprim (BACTRIM DS) 800-160 MG tablet Take 1 tablet by mouth 2 (two) times daily for 7 days. 14 tablet Trevor Iha, FNP      PDMP not reviewed this encounter.   Trevor Iha, FNP 05/04/21 1752

## 2021-05-04 NOTE — Discharge Instructions (Addendum)
Advised patient to take medication as directed with food to completion.  Encouraged patient increase daily water intake while taking this medication. 

## 2021-07-11 ENCOUNTER — Encounter: Payer: Self-pay | Admitting: Family Medicine

## 2021-07-27 ENCOUNTER — Encounter: Payer: Self-pay | Admitting: Family Medicine

## 2021-07-27 ENCOUNTER — Other Ambulatory Visit: Payer: Self-pay

## 2021-07-27 ENCOUNTER — Ambulatory Visit (INDEPENDENT_AMBULATORY_CARE_PROVIDER_SITE_OTHER): Payer: Federal, State, Local not specified - PPO | Admitting: Family Medicine

## 2021-07-27 VITALS — BP 131/72 | HR 83 | Resp 16 | Ht 68.0 in | Wt 241.0 lb

## 2021-07-27 DIAGNOSIS — Z23 Encounter for immunization: Secondary | ICD-10-CM

## 2021-07-27 DIAGNOSIS — G43809 Other migraine, not intractable, without status migrainosus: Secondary | ICD-10-CM | POA: Diagnosis not present

## 2021-07-27 DIAGNOSIS — F332 Major depressive disorder, recurrent severe without psychotic features: Secondary | ICD-10-CM | POA: Diagnosis not present

## 2021-07-27 DIAGNOSIS — F419 Anxiety disorder, unspecified: Secondary | ICD-10-CM | POA: Diagnosis not present

## 2021-07-27 MED ORDER — BUPROPION HCL ER (XL) 150 MG PO TB24: 150.0000 mg | ORAL_TABLET | Freq: Every day | ORAL | 0 refills | Status: DC

## 2021-07-27 MED ORDER — DULOXETINE HCL 60 MG PO CPEP: ORAL_CAPSULE | ORAL | 3 refills | Status: DC

## 2021-07-27 NOTE — Assessment & Plan Note (Signed)
Doing OK overall.  Seem to be triggered by cold her her period.

## 2021-07-27 NOTE — Assessment & Plan Note (Signed)
Under a lot of stressors this year.  Discussed options. She is happy with the Cymbalta.  Will add wellbutrin for next couple fo months to see if helpful. Had side effects with Buspar.  F/U in 8 weeks.

## 2021-07-27 NOTE — Progress Notes (Signed)
Established Patient Office Visit  Subjective:  Patient ID: Leah Cervantes, female    DOB: 22-Oct-2000  Age: 21 y.o. MRN: [id number]  CC:  Chief Complaint  Patient presents with   Depression    Follow up. Patient states Cymbalta is working well for her.     HPI Leah Cervantes presents for follow up for mood.  She is currently on Cymbalta 60mg  BID and doing well with it.  She does feel like considering what has gone on this year that the medication has been helpful even though she still really struggling with depression and anxiety symptoms.  I have not seen her in the last year.  Unfortunately back in April she had a fetal demise at 19 weeks.  Currently she feels like her anxiety is the most ramped up.  Work has been stressful it sounds like they are little short staffed and so she really cannot miss work. Her sister has been really supportive and her fiance.    Past Medical History:  Diagnosis Date   Anxiety    Depression     Past Surgical History:  Procedure Laterality Date   NO PAST SURGERIES      Family History  Problem Relation Age of Onset   Hearing loss Mother    Hypertension Mother    Fibromyalgia Sister    Stroke Maternal Grandmother    Heart disease Maternal Grandmother    Hypertension Maternal Grandmother    Fibromyalgia Maternal Grandfather    Raynaud syndrome Paternal Grandmother    Heart attack Paternal Grandfather    Bipolar disorder Maternal Uncle    Depression Father     Social History   Socioeconomic History   Marital status: Single    Spouse name: Not on file   Number of children: Not on file   Years of education: Not on file   Highest education level: Not on file  Occupational History   Not on file  Tobacco Use   Smoking status: Former    Packs/day: 0.35    Types: Cigarettes   Smokeless tobacco: Never  Vaping Use   Vaping Use: Never used  Substance and Sexual Activity   Alcohol use: Not Currently    Comment: occasional/very rare    Drug use: No   Sexual activity: Yes    Birth control/protection: None  Other Topics Concern   Not on file  Social History Narrative   Not on file   Social Determinants of Health   Financial Resource Strain: Not on file  Food Insecurity: Not on file  Transportation Needs: Not on file  Physical Activity: Not on file  Stress: Not on file  Social Connections: Not on file  Intimate Partner Violence: Not on file    Outpatient Medications Prior to Visit  Medication Sig Dispense Refill   DULoxetine (CYMBALTA) 60 MG capsule TAKE 1 CAPSULE(60 MG) BY MOUTH TWICE DAILY. 1 month supply given.must schedule/keep appointment for refills 60 capsule 0   DULoxetine (CYMBALTA) 60 MG capsule Take 1 capsule (60 mg total) by mouth 2 (two) times daily. (Patient not taking: Reported on 05/04/2021) 60 capsule 1   No facility-administered medications prior to visit.    Allergies  Allergen Reactions   Buspar [Buspirone] Other (See Comments)   Prednisone Hives    ROS Review of Systems    Objective:    Physical Exam Constitutional:      Appearance: Normal appearance. She is well-developed.  HENT:     Head: Normocephalic and  atraumatic.  Cardiovascular:     Rate and Rhythm: Normal rate and regular rhythm.     Heart sounds: Normal heart sounds.  Pulmonary:     Effort: Pulmonary effort is normal.     Breath sounds: Normal breath sounds.  Skin:    General: Skin is warm and dry.  Neurological:     Mental Status: She is alert and oriented to person, place, and time.  Psychiatric:        Behavior: Behavior normal.    BP 131/72    Pulse 83    Resp 16    Ht 5\' 8"  (1.727 m)    Wt 241 lb (109.3 kg)    LMP 07/07/2021    SpO2 98%    BMI 36.64 kg/m  Wt Readings from Last 3 Encounters:  07/27/21 241 lb (109.3 kg)  09/10/20 236 lb (107 kg)  08/18/20 236 lb (107 kg)     Health Maintenance Due  Topic Date Due   Pneumococcal Vaccine 66-13 Years old (1 - PCV) Never done   PAP-Cervical Cytology  Screening  Never done   PAP SMEAR-Modifier  Never done   CHLAMYDIA SCREENING  06/23/2021    There are no preventive care reminders to display for this patient.   Lab Results  Component Value Date   TSH 2.374 11/10/2018   Lab Results  Component Value Date   WBC 14.1 (H) 09/11/2020   HGB 14.1 09/11/2020   HCT 40.8 09/11/2020   MCV 93.4 09/11/2020   PLT 305 09/11/2020   Lab Results  Component Value Date   NA 138 11/10/2018   K 3.7 11/10/2018   CO2 24 11/10/2018   GLUCOSE 92 11/10/2018   BUN 15 11/10/2018   CREATININE 0.86 11/10/2018   BILITOT 0.5 11/10/2018   ALKPHOS 89 11/10/2018   AST 22 11/10/2018   ALT 28 11/10/2018   PROT 7.8 11/10/2018   ALBUMIN 4.3 11/10/2018   CALCIUM 9.0 11/10/2018   ANIONGAP 9 11/10/2018   Lab Results  Component Value Date   CHOL 147 11/10/2018   Lab Results  Component Value Date   HDL 36 (L) 11/10/2018   Lab Results  Component Value Date   LDLCALC 87 11/10/2018   Lab Results  Component Value Date   TRIG 122 11/10/2018   Lab Results  Component Value Date   CHOLHDL 4.1 11/10/2018   Lab Results  Component Value Date   HGBA1C 5.3 07/21/2020      Assessment & Plan:   Problem List Items Addressed This Visit       Cardiovascular and Mediastinum   Vestibular migraine    Doing OK overall.  Seem to be triggered by cold her her period.        Relevant Medications   DULoxetine (CYMBALTA) 60 MG capsule   buPROPion (WELLBUTRIN XL) 150 MG 24 hr tablet     Other   MDD (major depressive disorder), recurrent episode, severe ([place]) - Primary    Under a lot of stressors this year.  Discussed options. She is happy with the Cymbalta.  Will add wellbutrin for next couple fo months to see if helpful. Had side effects with Buspar.  F/U in 8 weeks.       Relevant Medications   DULoxetine (CYMBALTA) 60 MG capsule   buPROPion (WELLBUTRIN XL) 150 MG 24 hr tablet   Other Visit Diagnoses     Immunization due       Relevant Orders    HPV 9-valent vaccine,Recombinat (  Completed)   Anxiety       Relevant Medications   DULoxetine (CYMBALTA) 60 MG capsule   buPROPion (WELLBUTRIN XL) 150 MG 24 hr tablet       Meds ordered this encounter  Medications   DULoxetine (CYMBALTA) 60 MG capsule    Sig: TAKE 1 CAPSULE(60 MG) BY MOUTH TWICE DAILY.    Dispense:  180 capsule    Refill:  3   buPROPion (WELLBUTRIN XL) 150 MG 24 hr tablet    Sig: Take 1 tablet (150 mg total) by mouth daily.    Dispense:  90 tablet    Refill:  0    Follow-up: Return in about 8 weeks (around 09/21/2021) for Mood .    Beatrice Lecher, MD

## 2021-08-02 ENCOUNTER — Encounter: Payer: Self-pay | Admitting: Family Medicine

## 2021-08-05 ENCOUNTER — Other Ambulatory Visit: Payer: Self-pay

## 2021-08-05 ENCOUNTER — Emergency Department
Admission: EM | Admit: 2021-08-05 | Discharge: 2021-08-05 | Disposition: A | Discharge status: Home / Self Care | Payer: Federal, State, Local not specified - PPO | Source: Home / Self Care

## 2021-08-05 ENCOUNTER — Emergency Department (INDEPENDENT_AMBULATORY_CARE_PROVIDER_SITE_OTHER): Payer: Federal, State, Local not specified - PPO

## 2021-08-05 DIAGNOSIS — M79641 Pain in right hand: Secondary | ICD-10-CM

## 2021-08-05 DIAGNOSIS — M25531 Pain in right wrist: Secondary | ICD-10-CM | POA: Diagnosis not present

## 2021-08-05 DIAGNOSIS — S6991XA Unspecified injury of right wrist, hand and finger(s), initial encounter: Secondary | ICD-10-CM | POA: Diagnosis not present

## 2021-08-05 NOTE — Discharge Instructions (Addendum)
Advised/informed patient of right hand/right wrist x-ray results-were negative for acute osseous process.  Advised patient may use OTC Ibuprofen 600 to 800 mg 1-2 times daily, as needed for right hand pain. ?

## 2021-08-05 NOTE — ED Provider Notes (Signed)
?[facility] ? ? ? ?CSN: [id number] ?Arrival date & time: 08/05/21  1654 ? ? ?  ? ?History   ?Chief Complaint ?Chief Complaint  ?Patient presents with  ? Hand Injury  ? ? ?HPI ?Leah Cervantes is a 21 y.o. female.  ? ?HPI 21 year old female presents with right hand pain for 4 days.  PMH significant for paresthesias and MDD.  Patient reports right medial hand/thumb pain which radiates to her right wrist following injury.  Reports" play-fighting" with her boyfriend and hit this area of right hand against his arm. ? ?Past Medical History:  ?Diagnosis Date  ? Anxiety   ? Depression   ? ? ?Patient Active Problem List  ? Diagnosis Date Noted  ? Fetal demise affecting delivery 09/10/2020  ? Abnormal antenatal AFP screen 08/27/2020  ? Rubella non-immune status, antepartum 07/27/2020  ? Suicidal ideation   ? Supervision of other normal pregnancy, antepartum 06/23/2020  ? Lumbar radiculopathy 03/26/2020  ? MDD (major depressive disorder), recurrent episode, severe (HCC) 11/09/2018  ? Paresthesias 12/06/2017  ? Vestibular migraine 12/06/2017  ? Depression, recurrent (HCC) 08/26/2016  ? GAD (generalized anxiety disorder) 08/26/2016  ? Raynaud's disease without gangrene 06/13/2016  ? ? ?Past Surgical History:  ?Procedure Laterality Date  ? NO PAST SURGERIES    ? ? ?OB History   ? ? Gravida  ?1  ? Para  ?   ? Term  ?   ? Preterm  ?   ? AB  ?   ? Living  ?   ?  ? ? SAB  ?   ? IAB  ?   ? Ectopic  ?   ? Multiple  ?   ? Live Births  ?   ?   ?  ?  ? ? ? ?Home Medications   ? ?Prior to Admission medications   ?Medication Sig Start Date End Date Taking? Authorizing Provider  ?buPROPion (WELLBUTRIN XL) 150 MG 24 hr tablet Take 1 tablet (150 mg total) by mouth daily. 07/27/21   Agapito Games, MD  ?DULoxetine (CYMBALTA) 60 MG capsule TAKE 1 CAPSULE(60 MG) BY MOUTH TWICE DAILY. 07/27/21   Agapito Games, MD  ? ? ?Family History ?Family History  ?Problem Relation Age of Onset  ? Hearing loss Mother   ? Hypertension  Mother   ? Fibromyalgia Sister   ? Stroke Maternal Grandmother   ? Heart disease Maternal Grandmother   ? Hypertension Maternal Grandmother   ? Fibromyalgia Maternal Grandfather   ? Raynaud syndrome Paternal Grandmother   ? Heart attack Paternal Grandfather   ? Bipolar disorder Maternal Uncle   ? Depression Father   ? ? ?Social History ?Social History  ? ?Tobacco Use  ? Smoking status: Former  ?  Packs/day: 0.35  ?  Types: Cigarettes  ? Smokeless tobacco: Never  ?Vaping Use  ? Vaping Use: Every day  ?Substance Use Topics  ? Alcohol use: Yes  ?  Comment: occasional/very rare  ? Drug use: No  ? ? ? ?Allergies   ?Buspar [buspirone] and Prednisone ? ? ?Review of Systems ?Review of Systems  ?Musculoskeletal:   ?     Right hand swelling x   ? ? ?Physical Exam ?Triage Vital Signs ?ED Triage Vitals  ?Enc Vitals Group  ?   BP   ?   Pulse   ?   Resp   ?   Temp   ?   Temp src   ?   SpO2   ?  Weight   ?   Height   ?   Head Circumference   ?   Peak Flow   ?   Pain Score   ?   Pain Loc   ?   Pain Edu?   ?   Excl. in GC?   ? ?No data found. ? ?Updated Vital Signs ?BP 131/82   Pulse 92   Temp 99 ?F (37.2 ?C) (Oral)   Resp 20   Ht 5\' 8"  (1.727 m)   Wt 241 lb (109.3 kg)   LMP 08/04/2021 (Exact Date)   SpO2 98%   BMI 36.64 kg/m?  ? ?  ? ?Physical Exam ?Vitals and nursing note reviewed.  ?Constitutional:   ?   Appearance: Normal appearance. She is obese.  ?HENT:  ?   Head: Normocephalic and atraumatic.  ?   Mouth/Throat:  ?   Mouth: Mucous membranes are moist.  ?   Pharynx: Oropharynx is clear.  ?Eyes:  ?   Extraocular Movements: Extraocular movements intact.  ?   Conjunctiva/sclera: Conjunctivae normal.  ?   Pupils: Pupils are equal, round, and reactive to light.  ?Cardiovascular:  ?   Rate and Rhythm: Normal rate and regular rhythm.  ?   Pulses: Normal pulses.  ?   Heart sounds: Normal heart sounds.  ?Pulmonary:  ?   Effort: Pulmonary effort is normal.  ?   Breath sounds: Normal breath sounds.  ?Musculoskeletal:  ?    Cervical back: Normal range of motion and neck supple.  ?   Comments: Right hand (dorsum): TTP over first proximal metacarpal, trapezoid, trapezium, with mild soft tissue swelling noted, neurosensory intact, neurovascular intact, brisk cap refill, grip 4/5  ?Skin: ?   General: Skin is warm and dry.  ?Neurological:  ?   General: No focal deficit present.  ?   Mental Status: She is alert and oriented to person, place, and time.  ? ? ? ?UC Treatments / Results  ?Labs ?(all labs ordered are listed, but only abnormal results are displayed) ?Labs Reviewed - No data to display ? ?EKG ? ? ?Radiology ?DG Wrist Complete Right ? ?Result Date: 08/05/2021 ?CLINICAL DATA:  Hand injury EXAM: RIGHT HAND - COMPLETE 3 VIEW; RIGHT WRIST - COMPLETE 3 VIEW COMPARISON:  None. FINDINGS: There is no evidence of fracture or dislocation. There is no evidence of arthropathy or other focal bone abnormality. Soft tissues are unremarkable. IMPRESSION: No acute osseous abnormality. Electronically Signed   By: 10/05/2021 M.D.   On: 08/05/2021 17:32  ? ?DG Hand Complete Right ? ?Result Date: 08/05/2021 ?CLINICAL DATA:  Hand injury EXAM: RIGHT HAND - COMPLETE 3 VIEW; RIGHT WRIST - COMPLETE 3 VIEW COMPARISON:  None. FINDINGS: There is no evidence of fracture or dislocation. There is no evidence of arthropathy or other focal bone abnormality. Soft tissues are unremarkable. IMPRESSION: No acute osseous abnormality. Electronically Signed   By: 10/05/2021 M.D.   On: 08/05/2021 17:32   ? ?Procedures ?Procedures (including critical care time) ? ?Medications Ordered in UC ?Medications - No data to display ? ?Initial Impression / Assessment and Plan / UC Course  ?I have reviewed the triage vital signs and the nursing notes. ? ?Pertinent labs & imaging results that were available during my care of the patient were reviewed by me and considered in my medical decision making (see chart for details). ? ?  ? ?MDM: 1.  Right hand pain-advised/informed  patient of right hand/right wrist x-rays-no acute osseous abnormality.  Advised patient may use OTC Ibuprofen 600 to 800 mg 1-2 times daily, as needed for right hand pain.  Patient discharged home, hemodynamically stable. ?Final Clinical Impressions(s) / UC Diagnoses  ? ?Final diagnoses:  ?Right hand pain  ? ? ? ?Discharge Instructions   ? ?  ?Advised/informed patient of right hand/right wrist x-ray results-were negative for acute osseous process.  Advised patient may use OTC Ibuprofen 600 to 800 mg 1-2 times daily, as needed for right hand pain. ? ? ? ? ?ED Prescriptions   ?None ?  ? ?PDMP not reviewed this encounter. ?  ?Trevor Iha, FNP ?08/05/21 1800 ? ?

## 2021-08-05 NOTE — ED Triage Notes (Signed)
Pt presents to Urgent Care with c/o R medial hand/thumb pain which radiates to R medial wrist following injury 4 days ago. States she was "play-fighting" her boyfriend and hit this area against his arm. Bruising noted.  ?

## 2021-08-06 ENCOUNTER — Encounter: Payer: Self-pay | Admitting: Family Medicine

## 2021-08-30 ENCOUNTER — Emergency Department
Admission: EM | Admit: 2021-08-30 | Discharge: 2021-08-30 | Disposition: A | Discharge status: Home / Self Care | Payer: Federal, State, Local not specified - PPO | Source: Home / Self Care | Attending: Family Medicine | Admitting: Family Medicine

## 2021-08-30 DIAGNOSIS — J069 Acute upper respiratory infection, unspecified: Secondary | ICD-10-CM

## 2021-08-30 LAB — POC SARS CORONAVIRUS 2 AG -  ED: SARS Coronavirus 2 Ag: NEGATIVE

## 2021-08-30 MED ORDER — AZITHROMYCIN 250 MG PO TABS: ORAL_TABLET | ORAL | 0 refills | Status: DC

## 2021-08-30 MED ORDER — BENZONATATE 200 MG PO CAPS: 200.0000 mg | ORAL_CAPSULE | Freq: Three times a day (TID) | ORAL | 0 refills | Status: DC | PRN

## 2021-08-30 NOTE — ED Provider Notes (Signed)
?[facility] ? ? ? ?CSN: [id number] ?Arrival date & time: 08/30/21  9735 ? ? ?  ? ?History   ?Chief Complaint ?Chief Complaint  ?Patient presents with  ? Cough  ?  Cough, chest congestion, and chills. X3 days  ? ? ?HPI ?Leah Cervantes is a 21 y.o. female.  ? ?HPI ? ?Patient is here for upper respiratory infection.  She states her fianc? has "bronchitis".  He has been coughing for couple of weeks.  She is only been coughing for several days.  Feels like she has the same thing he does.  She has cough, chest congestion, chills, body aches, fatigue.  Is been going on since last week.  She has not been vaccinated for COVID or flu. ?She denies underlying lung disease or asthma ?Patient is a smoker.  Is advised to quit ? ?Past Medical History:  ?Diagnosis Date  ? Anxiety   ? Depression   ? ? ?Patient Active Problem List  ? Diagnosis Date Noted  ? Fetal demise affecting delivery 09/10/2020  ? Abnormal antenatal AFP screen 08/27/2020  ? Rubella non-immune status, antepartum 07/27/2020  ? Suicidal ideation   ? Supervision of other normal pregnancy, antepartum 06/23/2020  ? Lumbar radiculopathy 03/26/2020  ? MDD (major depressive disorder), recurrent episode, severe (HCC) 11/09/2018  ? Paresthesias 12/06/2017  ? Vestibular migraine 12/06/2017  ? Depression, recurrent (HCC) 08/26/2016  ? GAD (generalized anxiety disorder) 08/26/2016  ? Raynaud's disease without gangrene 06/13/2016  ? ? ?Past Surgical History:  ?Procedure Laterality Date  ? NO PAST SURGERIES    ? ? ?OB History   ? ? Gravida  ?1  ? Para  ?   ? Term  ?   ? Preterm  ?   ? AB  ?   ? Living  ?   ?  ? ? SAB  ?   ? IAB  ?   ? Ectopic  ?   ? Multiple  ?   ? Live Births  ?   ?   ?  ?  ? ? ? ?Home Medications   ? ?Prior to Admission medications   ?Medication Sig Start Date End Date Taking? Authorizing Provider  ?azithromycin (ZITHROMAX Z-PAK) 250 MG tablet Take two pills today followed by one a day until gone 08/30/21  Yes Eustace Moore, MD  ?benzonatate  (TESSALON) 200 MG capsule Take 1 capsule (200 mg total) by mouth 3 (three) times daily as needed for cough. 08/30/21  Yes Eustace Moore, MD  ?buPROPion (WELLBUTRIN XL) 150 MG 24 hr tablet Take 1 tablet (150 mg total) by mouth daily. 07/27/21   Agapito Games, MD  ?DULoxetine (CYMBALTA) 60 MG capsule TAKE 1 CAPSULE(60 MG) BY MOUTH TWICE DAILY. 07/27/21   Agapito Games, MD  ? ? ?Family History ?Family History  ?Problem Relation Age of Onset  ? Hearing loss Mother   ? Hypertension Mother   ? Fibromyalgia Sister   ? Stroke Maternal Grandmother   ? Heart disease Maternal Grandmother   ? Hypertension Maternal Grandmother   ? Fibromyalgia Maternal Grandfather   ? Raynaud syndrome Paternal Grandmother   ? Heart attack Paternal Grandfather   ? Bipolar disorder Maternal Uncle   ? Depression Father   ? ? ?Social History ?Social History  ? ?Tobacco Use  ? Smoking status: Every Day  ?  Packs/day: 0.35  ?  Types: Cigarettes  ? Smokeless tobacco: Never  ?Vaping Use  ? Vaping Use: Every day  ?  Substance Use Topics  ? Alcohol use: Yes  ?  Comment: occasional/very rare  ? Drug use: No  ? ? ? ?Allergies   ?Buspar [buspirone] and Prednisone ? ? ?Review of Systems ?Review of Systems ?See HPI ? ?Physical Exam ?Triage Vital Signs ?ED Triage Vitals  ?Enc Vitals Group  ?   BP 08/30/21 0908 136/82  ?   Pulse Rate 08/30/21 0908 83  ?   Resp 08/30/21 0908 20  ?   Temp 08/30/21 0908 98.4 ?F (36.9 ?C)  ?   Temp Source 08/30/21 0908 Oral  ?   SpO2 08/30/21 0908 99 %  ?   Weight 08/30/21 0906 230 lb (104.3 kg)  ?   Height 08/30/21 0906 5\' 8"  (1.727 m)  ?   Head Circumference --   ?   Peak Flow --   ?   Pain Score 08/30/21 0905 0  ?   Pain Loc --   ?   Pain Edu? --   ?   Excl. in GC? --   ? ?No data found. ? ?Updated Vital Signs ?BP 136/82 (BP Location: Left Arm)   Pulse 83   Temp 98.4 ?F (36.9 ?C) (Oral)   Resp 20   Ht 5\' 8"  (1.727 m)   Wt 104.3 kg   LMP 08/29/2021 (Exact Date)   SpO2 99%   BMI 34.97 kg/m?    ? ?Physical  Exam ?Constitutional:   ?   General: She is not in acute distress. ?   Appearance: She is well-developed.  ?HENT:  ?   Head: Normocephalic and atraumatic.  ?   Right Ear: Tympanic membrane and ear canal normal.  ?   Left Ear: Tympanic membrane and ear canal normal.  ?   Nose: Congestion and rhinorrhea present.  ?   Mouth/Throat:  ?   Pharynx: Posterior oropharyngeal erythema present.  ?Eyes:  ?   Conjunctiva/sclera: Conjunctivae normal.  ?   Pupils: Pupils are equal, round, and reactive to light.  ?Cardiovascular:  ?   Rate and Rhythm: Normal rate and regular rhythm.  ?   Heart sounds: Normal heart sounds.  ?Pulmonary:  ?   Effort: Pulmonary effort is normal. No respiratory distress.  ?   Breath sounds: Wheezing and rhonchi present.  ?   Comments: Anterior rhonchi.  Scattered wheeze inspiratory ?Abdominal:  ?   General: There is no distension.  ?   Palpations: Abdomen is soft.  ?Musculoskeletal:     ?   General: Normal range of motion.  ?   Cervical back: Normal range of motion.  ?Lymphadenopathy:  ?   Cervical: Cervical adenopathy present.  ?Skin: ?   General: Skin is warm and dry.  ?Neurological:  ?   Mental Status: She is alert.  ? ? ? ?UC Treatments / Results  ?Labs ?(all labs ordered are listed, but only abnormal results are displayed) ?Labs Reviewed  ?POC SARS CORONAVIRUS 2 AG -  ED - Normal  ? ? ?EKG ? ? ?Radiology ?No results found. ? ?Procedures ?Procedures (including critical care time) ? ?Medications Ordered in UC ?Medications - No data to display ? ?Initial Impression / Assessment and Plan / UC Course  ?I have reviewed the triage vital signs and the nursing notes. ? ?Pertinent labs & imaging results that were available during my care of the patient were reviewed by me and considered in my medical decision making (see chart for details). ? ?  ? ?Final Clinical Impressions(s) / UC Diagnoses  ? ?  Final diagnoses:  ?Viral URI with cough  ? ? ? ?Discharge Instructions   ? ?  ?Drink lots of fluids ?Take the  Tessalon 2-3 times a day for cough ?Take Z-Pak as directed ?Get plenty of rest ?Call your PCP if you fail to improve in a few days ? ? ?ED Prescriptions   ? ? Medication Sig Dispense Auth. Provider  ? benzonatate (TESSALON) 200 MG capsule Take 1 capsule (200 mg total) by mouth 3 (three) times daily as needed for cough. 21 capsule Eustace Moore, MD  ? azithromycin (ZITHROMAX Z-PAK) 250 MG tablet Take two pills today followed by one a day until gone 6 tablet Delton See Letta Pate, MD  ? ?  ? ?PDMP not reviewed this encounter. ?  ?Eustace Moore, MD ?08/30/21 1011 ? ?

## 2021-08-30 NOTE — Discharge Instructions (Signed)
Drink lots of fluids ?Take the Tessalon 2-3 times a day for cough ?Take Z-Pak as directed ?Get plenty of rest ?Call your PCP if you fail to improve in a few days ?

## 2021-08-30 NOTE — ED Triage Notes (Signed)
Pt states that she has a cough, chest congestion and chills. X3 days ? ?Pt states that she has loss of voice.  ? ?Pt states that she isn't vaccinated for covid.  ?Pt states that she hasn't had flu vaccine.  ?

## 2021-10-25 ENCOUNTER — Other Ambulatory Visit: Payer: Self-pay | Admitting: Family Medicine

## 2021-10-25 DIAGNOSIS — F332 Major depressive disorder, recurrent severe without psychotic features: Secondary | ICD-10-CM

## 2021-10-26 NOTE — Telephone Encounter (Signed)
Please call pt she will need an appt for refills

## 2021-10-27 NOTE — Telephone Encounter (Signed)
Pt has been scheduled for next available with Dr Madilyn Fireman on 11/30/21. AMUCK

## 2021-11-30 ENCOUNTER — Encounter: Payer: Self-pay | Admitting: Family Medicine

## 2021-11-30 ENCOUNTER — Ambulatory Visit (INDEPENDENT_AMBULATORY_CARE_PROVIDER_SITE_OTHER): Payer: Federal, State, Local not specified - PPO | Admitting: Family Medicine

## 2021-11-30 VITALS — BP 122/61 | HR 73 | Resp 18 | Ht 68.0 in | Wt 230.0 lb

## 2021-11-30 DIAGNOSIS — F332 Major depressive disorder, recurrent severe without psychotic features: Secondary | ICD-10-CM | POA: Diagnosis not present

## 2021-11-30 DIAGNOSIS — F411 Generalized anxiety disorder: Secondary | ICD-10-CM

## 2021-11-30 MED ORDER — QUETIAPINE FUMARATE 25 MG PO TABS: 25.0000 mg | ORAL_TABLET | Freq: Every day | ORAL | 0 refills | Status: DC

## 2021-11-30 NOTE — Progress Notes (Signed)
Established Patient Office Visit  Subjective   Patient ID: Leah Cervantes, female    DOB: 2000/11/28  Age: 21 y.o. MRN: [id number]  Chief Complaint  Patient presents with   Depression    Patient in office to follow up on Wellbutrin and Cymbalta. Patient states she does not notice a difference on the wellbutrin. Patient states she would like to stay on Cymbalta but would like to discuss the wellbutrin     HPI  Follow-up depression/anxiety-she has really done well on Cymbalta but last time I saw her she was struggling a little bit more so we decided to add Wellbutrin as add-on therapy back in February.  She was post to follow-up in 8 weeks.  She has tried buspirone in the past and really felt like it was not helpful.  She still having significant difficulty with sleep.  She says some nights she does not sleep well at all and then other times she sleeps excessively.  She has had thoughts of not wanting to be here but denies active suicidal thoughts. She is here today with her boyfriend.  She definitely wants to continue with the Cymbalta as she feels like that actually has been really helpful.  She says she is planning on doing some grief counseling through work.  She is trying to get it arranged.  Her boyfriend is here today and seems very supportive.    ROS    Objective:     BP 122/61   Pulse 73   Resp 18   Ht 5\' 8"  (1.727 m)   Wt 230 lb (104.3 kg)   LMP 11/20/2021   SpO2 98%   BMI 34.97 kg/m    Physical Exam Vitals and nursing note reviewed.  Constitutional:      Appearance: She is well-developed.  HENT:     Head: Normocephalic and atraumatic.  Cardiovascular:     Rate and Rhythm: Normal rate and regular rhythm.     Heart sounds: Normal heart sounds.  Pulmonary:     Effort: Pulmonary effort is normal.     Breath sounds: Normal breath sounds.  Skin:    General: Skin is warm and dry.  Neurological:     Mental Status: She is alert and oriented to person, place,  and time.  Psychiatric:        Behavior: Behavior normal.     No results found for any visits on 11/30/21.    The ASCVD Risk score (Arnett DK, et al., 2019) failed to calculate for the following reasons:   The 2019 ASCVD risk score is only valid for ages 39 to 70    Assessment & Plan:   Problem List Items Addressed This Visit       Other   MDD (major depressive disorder), recurrent episode, severe (HCC) - Primary    Still has significant depression and anxiety symptoms even though she does feel the Cymbalta is really helpful she is already taking 60 mg capsule twice a day.  We discussed adding quetiapine for low mood and anxiety to see if it is helpful she did not get good results with buspirone or with Wellbutrin.  Follow-up in 1 to 2 months.  We can adjust dose if needed.  May also help with sleep onset.  Also encouraged her to think of ways to help reduce her stress levels during the day.  I am also excited to hear that she is going to start engaging in some therapy/counseling through work for grief.  Relevant Medications   QUEtiapine (SEROQUEL) 25 MG tablet   GAD (generalized anxiety disorder)   Relevant Medications   QUEtiapine (SEROQUEL) 25 MG tablet    Return for 1- 2 months for , New start medication.   I spent 22 minutes on the day of the encounter to include pre-visit record review, face-to-face time with the patient and post visit ordering of test.   [name], MD

## 2022-07-22 ENCOUNTER — Ambulatory Visit
Admission: EM | Admit: 2022-07-22 | Discharge: 2022-07-22 | Disposition: A | Discharge status: Home / Self Care | Payer: 59 | Attending: Emergency Medicine | Admitting: Emergency Medicine

## 2022-07-22 ENCOUNTER — Encounter: Payer: Self-pay | Admitting: Emergency Medicine

## 2022-07-22 DIAGNOSIS — L02214 Cutaneous abscess of groin: Secondary | ICD-10-CM

## 2022-07-22 DIAGNOSIS — R509 Fever, unspecified: Secondary | ICD-10-CM | POA: Diagnosis not present

## 2022-07-22 MED ORDER — IBUPROFEN 800 MG PO TABS: 800.0000 mg | ORAL_TABLET | Freq: Four times a day (QID) | ORAL | 0 refills | Status: DC | PRN

## 2022-07-22 MED ORDER — DOXYCYCLINE HYCLATE 100 MG PO CAPS: 100.0000 mg | ORAL_CAPSULE | Freq: Two times a day (BID) | ORAL | 0 refills | Status: AC

## 2022-07-22 MED ORDER — ACETAMINOPHEN 325 MG PO TABS
650.0000 mg | ORAL_TABLET | Freq: Once | ORAL | Status: AC
  Administered 2022-07-22: 650 mg via ORAL

## 2022-07-22 NOTE — Discharge Instructions (Addendum)
Please take medication as prescribed. Take with food to avoid upset stomach. Finish full course. Continue warm compress.  For pain, take ibuprofen 3 times daily. This will help to reduce inflammation as well.  Follow up with your primary care provider regarding long-term management.

## 2022-07-22 NOTE — ED Provider Notes (Signed)
[facility]    CSN: [id number] Arrival date & time: 07/22/22  1413      History   Chief Complaint Chief Complaint  Patient presents with   Abscess    HPI Leah Cervantes is a 22 y.o. female.  Presents with abscess on the left inner thigh  Started 4 days ago. Reports history of same Has tried warm compress which usually resolves it Today pain is 8/10 Has not taken any medications for pain  Past Medical History:  Diagnosis Date   Anxiety    Depression     Patient Active Problem List   Diagnosis Date Noted   Fetal demise affecting delivery 09/10/2020   Abnormal antenatal AFP screen 08/27/2020   Rubella non-immune status, antepartum 07/27/2020   Suicidal ideation    Lumbar radiculopathy 03/26/2020   MDD (major depressive disorder), recurrent episode, severe ([place]) 11/09/2018   Paresthesias 12/06/2017   Vestibular migraine 12/06/2017   Depression, recurrent ([place]) 08/26/2016   GAD (generalized anxiety disorder) 08/26/2016   Raynaud's disease without gangrene 06/13/2016    Past Surgical History:  Procedure Laterality Date   NO PAST SURGERIES      OB History     Gravida  1   Para      Term      Preterm      AB      Living         SAB      IAB      Ectopic      Multiple      Live Births               Home Medications    Prior to Admission medications   Medication Sig Start Date End Date Taking? Authorizing Provider  doxycycline (VIBRAMYCIN) 100 MG capsule Take 1 capsule (100 mg total) by mouth 2 (two) times daily for 7 days. 07/22/22 07/29/22 Yes Mckinnley Smithey, Wells Guiles, PA-C  ibuprofen (ADVIL) 800 MG tablet Take 1 tablet (800 mg total) by mouth every 6 (six) hours as needed. 07/22/22  Yes Trimaine Maser, Wells Guiles, PA-C  DULoxetine (CYMBALTA) 60 MG capsule TAKE 1 CAPSULE(60 MG) BY MOUTH TWICE DAILY. 07/27/21   Hali Marry, MD    Family History Family History  Problem Relation Age of Onset   Hearing loss Mother    Hypertension  Mother    Fibromyalgia Sister    Stroke Maternal Grandmother    Heart disease Maternal Grandmother    Hypertension Maternal Grandmother    Fibromyalgia Maternal Grandfather    Raynaud syndrome Paternal Grandmother    Heart attack Paternal Grandfather    Bipolar disorder Maternal Uncle    Depression Father     Social History Social History   Tobacco Use   Smoking status: Every Day    Packs/day: 0.35    Types: Cigarettes   Smokeless tobacco: Current    Types: Chew   Tobacco comments:    Occ smoker. Vape   Vaping Use   Vaping Use: Every day   Substances: Nicotine, Flavoring  Substance Use Topics   Alcohol use: Yes    Comment: occasional/very rare   Drug use: No     Allergies   Buspar [buspirone] and Prednisone   Review of Systems Review of Systems As per HPI  Physical Exam Triage Vital Signs ED Triage Vitals  Enc Vitals Group     BP      Pulse      Resp      Temp  Temp src      SpO2      Weight      Height      Head Circumference      Peak Flow      Pain Score      Pain Loc      Pain Edu?      Excl. in [place]?    No data found.  Updated Vital Signs BP 114/72 (BP Location: Left Arm)   Pulse (!) 102   Temp 99 F (37.2 C) (Oral)   Resp 16   Ht 5' 8"$  (1.727 m)   LMP 07/07/2022 (Approximate)   SpO2 97%   Breastfeeding No   BMI 34.97 kg/m    Physical Exam Vitals and nursing note reviewed.  HENT:     Mouth/Throat:     Mouth: Mucous membranes are moist.     Pharynx: Oropharynx is clear.  Eyes:     Conjunctiva/sclera: Conjunctivae normal.  Cardiovascular:     Rate and Rhythm: Normal rate and regular rhythm.  Pulmonary:     Effort: Pulmonary effort is normal.  Skin:    General: Skin is warm and dry.     Findings: Abscess present. No erythema.     Comments: Left inner thigh with firm abscess. Tender. Some induration. Scar tissue noted. No drainage. Not warm or red.   Neurological:     Mental Status: She is alert and oriented to person,  place, and time.  Psychiatric:        Mood and Affect: Affect is tearful.     UC Treatments / Results  Labs (all labs ordered are listed, but only abnormal results are displayed) Labs Reviewed - No data to display  EKG   Radiology No results found.  Procedures Procedures (including critical care time)  Medications Ordered in UC Medications  acetaminophen (TYLENOL) tablet 650 mg (650 mg Oral Given 07/22/22 1438)    Initial Impression / Assessment and Plan / UC Course  I have reviewed the triage vital signs and the nursing notes.  Pertinent labs & imaging results that were available during my care of the patient were reviewed by me and considered in my medical decision making (see chart for details).  Patient with low grade fever in clinic. Tylenol dose given Temp resolved, pain improved Abscess is firm and cannot be drained in clinic. Doxy BID x 7 days Discussed pain control with ibu, tylenol Return precautions discussed. Patient agrees to plan  Final Clinical Impressions(s) / UC Diagnoses   Final diagnoses:  Abscess of left groin  Fever in adult     Discharge Instructions      Please take medication as prescribed. Take with food to avoid upset stomach. Finish full course. Continue warm compress.  For pain, take ibuprofen 3 times daily. This will help to reduce inflammation as well.  Follow up with your primary care provider regarding long-term management.      ED Prescriptions     Medication Sig Dispense Auth. Provider   doxycycline (VIBRAMYCIN) 100 MG capsule Take 1 capsule (100 mg total) by mouth 2 (two) times daily for 7 days. 14 capsule Dory Demont, PA-C   ibuprofen (ADVIL) 800 MG tablet Take 1 tablet (800 mg total) by mouth every 6 (six) hours as needed. 21 tablet Vincent Ehrler, Wells Guiles, PA-C      PDMP not reviewed this encounter.   Leah Cervantes 07/22/22 1530

## 2022-07-22 NOTE — ED Triage Notes (Signed)
Boil/abscess to left inner thigh Hx of same  Started on Monday  Warm compresses Usually resolve on own  Pt tearful in triage due to pain

## 2022-08-24 ENCOUNTER — Encounter: Payer: Self-pay | Admitting: Family Medicine

## 2022-08-24 DIAGNOSIS — F332 Major depressive disorder, recurrent severe without psychotic features: Secondary | ICD-10-CM

## 2022-08-26 MED ORDER — DULOXETINE HCL 60 MG PO CPEP: 60.0000 mg | ORAL_CAPSULE | Freq: Every day | ORAL | 0 refills | Status: DC

## 2022-08-29 NOTE — Telephone Encounter (Signed)
No records of missed calls or messages documented from Triage coverage for this particular request.

## 2022-09-26 ENCOUNTER — Ambulatory Visit (INDEPENDENT_AMBULATORY_CARE_PROVIDER_SITE_OTHER): Payer: 59 | Admitting: Family Medicine

## 2022-09-26 VITALS — BP 116/55 | HR 90 | Ht 68.0 in | Wt 216.0 lb

## 2022-09-26 DIAGNOSIS — N926 Irregular menstruation, unspecified: Secondary | ICD-10-CM

## 2022-09-26 DIAGNOSIS — F332 Major depressive disorder, recurrent severe without psychotic features: Secondary | ICD-10-CM

## 2022-09-26 DIAGNOSIS — Z113 Encounter for screening for infections with a predominantly sexual mode of transmission: Secondary | ICD-10-CM | POA: Diagnosis not present

## 2022-09-26 DIAGNOSIS — Z566 Other physical and mental strain related to work: Secondary | ICD-10-CM | POA: Diagnosis not present

## 2022-09-26 MED ORDER — DULOXETINE HCL 60 MG PO CPEP: 60.0000 mg | ORAL_CAPSULE | Freq: Every day | ORAL | 3 refills | Status: DC

## 2022-09-26 NOTE — Assessment & Plan Note (Addendum)
Continue Cymbalta 60 mg daily.  Stable on current regimen.

## 2022-09-26 NOTE — Progress Notes (Addendum)
Established Patient Office Visit  Subjective   Patient ID: Leah Cervantes, female    DOB: 08-09-2000  Age: 22 y.o. MRN: [id number]  Chief Complaint  Patient presents with   mood    Previous PHQ= 22/ED GAD= 21/ED    HPI  Follow-up major depressive disorder-overall she actually feels like she is doing really well.  PHQ-9 score of 6.  She is happy with her current regimen with her Cymbalta.  She was getting a lot of nausea when she was on 120 mg but but since being on 60 she is actually done really well.  GAD-7 score of 11 today but rates her symptoms as not difficult.  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  She is also due for a Pap smear.  She has not had 1 yet.  She does report heavy bleeding for 3 days  with each period.  She is about 5 days late this time.    ROS    Objective:     BP (!) 116/55   Pulse 90   Ht  (1.727 m)   Wt 216 lb (98 kg)   SpO2 99%   BMI 32.84 kg/m    Physical Exam Vitals and nursing note reviewed.  Constitutional:      Appearance: She is well-developed.  HENT:     Head: Normocephalic and atraumatic.  Cardiovascular:     Rate and Rhythm: Normal rate and regular rhythm.     Heart sounds: Normal heart sounds.  Pulmonary:     Effort: Pulmonary effort is normal.     Breath sounds: Normal breath sounds.  Skin:    General: Skin is warm and dry.  Neurological:     Mental Status: She is alert and oriented to person, place, and time.  Psychiatric:        Behavior: Behavior normal.      No results found for any visits on 09/26/22.    The ASCVD Risk score (Arnett DK, et al., 2019) failed to calculate for the following reasons:   The 2019 ASCVD risk score is only valid for ages 5 to 43    Assessment & Plan:   Problem List Items Addressed This Visit       Other   MDD (major depressive disorder), recurrent episode, severe - Primary     Continue Cymbalta 60 mg daily.  Stable on current regimen.      Relevant Medications   DULoxetine (CYMBALTA) 60 MG capsule   Other Visit Diagnoses     Screening examination for STI       Relevant Orders   C. trachomatis/N. gonorrhoeae RNA   Late menses       Work stress           Menses-she did do a pregnancy test at home and it was negative so encouraged her to test again at the end of the week.  She is interested in getting her Tdap updated but wanted to make sure that she was not pregnant first.  We can always schedule to do it when she comes back in about 2 months for her Pap smear.  Stress should hopefully improve once they get additional hires.  Did discuss that if at any point she is interested in birth control I am happy to put her back on it.  Return in about 9 weeks (around 11/28/2022)  for Well woman wiht pap smear.    [name], MD

## 2022-09-27 LAB — C. TRACHOMATIS/N. GONORRHOEAE RNA
C. trachomatis RNA, TMA: NOT DETECTED
N. gonorrhoeae RNA, TMA: NOT DETECTED

## 2022-09-28 NOTE — Progress Notes (Signed)
Negative for gonorrhea and chlamydia.

## 2022-12-06 ENCOUNTER — Ambulatory Visit (INDEPENDENT_AMBULATORY_CARE_PROVIDER_SITE_OTHER): Payer: 59 | Admitting: Family Medicine

## 2022-12-06 ENCOUNTER — Encounter: Payer: Self-pay | Admitting: Family Medicine

## 2022-12-06 VITALS — BP 114/53 | HR 69 | Ht 68.0 in | Wt 211.0 lb

## 2022-12-06 DIAGNOSIS — Z0000 Encounter for general adult medical examination without abnormal findings: Secondary | ICD-10-CM | POA: Diagnosis not present

## 2022-12-06 DIAGNOSIS — Z23 Encounter for immunization: Secondary | ICD-10-CM

## 2022-12-06 DIAGNOSIS — Z124 Encounter for screening for malignant neoplasm of cervix: Secondary | ICD-10-CM

## 2022-12-06 NOTE — Progress Notes (Signed)
Complete physical exam  Patient: Leah Cervantes   DOB: 12-05-2000   22 y.o. Female  MRN: [id number]  Subjective:    No chief complaint on file.   Leah Cervantes is a 22 y.o. female who presents today for a complete physical exam. She reports consuming a general diet. The patient does not participate in regular exercise at present. She generally feels well. She reports sleeping fairly well. She does not have additional problems to discuss today.  Her periods are regular.   Most recent fall risk assessment:    12/06/2022    4:39 PM  Fall Risk   Falls in the past year? 0  Number falls in past yr: 0  Injury with Fall? 0  Risk for fall due to : No Fall Risks  Follow up Falls evaluation completed     Most recent depression screenings:    09/26/2022    3:10 PM 11/30/2021    2:01 PM  PHQ 2/9 Scores  PHQ - 2 Score 0 6  PHQ- 9 Score 6 22        Patient Care Team: Agapito Games, MD as PCP - General (Family Medicine)   Outpatient Medications Prior to Visit  Medication Sig   DULoxetine (CYMBALTA) 60 MG capsule Take 1 capsule (60 mg total) by mouth daily.   No facility-administered medications prior to visit.    ROS        Objective:     BP (!) 114/53   Pulse 69   Ht 5\' 8"  (1.727 m)   Wt 211 lb (95.7 kg)   LMP 11/22/2022 (Approximate)   SpO2 99%   BMI 32.08 kg/m    Physical Exam Vitals and nursing note reviewed. Exam conducted with a chaperone present.  Constitutional:      Appearance: She is well-developed.  HENT:     Head: Normocephalic and atraumatic.     Right Ear: Tympanic membrane, ear canal and external ear normal.     Left Ear: Tympanic membrane, ear canal and external ear normal.     Nose: Nose normal.  Eyes:     Conjunctiva/sclera: Conjunctivae normal.     Pupils: Pupils are equal, round, and reactive to light.  Neck:     Thyroid: No thyromegaly.  Cardiovascular:     Rate and Rhythm: Normal rate and regular rhythm.     Heart  sounds: Normal heart sounds.  Pulmonary:     Effort: Pulmonary effort is normal.     Breath sounds: Normal breath sounds. No wheezing.  Genitourinary:    Exam position: Supine.     Pubic Area: No rash.      Labia:        Right: No rash.        Left: No rash.      Vagina: Normal. No vaginal discharge.     Cervix: Normal.     Uterus: Normal.      Adnexa: Right adnexa normal and left adnexa normal.     Rectum: Normal.  Musculoskeletal:     Cervical back: Neck supple.  Lymphadenopathy:     Cervical: No cervical adenopathy.  Skin:    General: Skin is warm and dry.     Coloration: Skin is not pale.  Neurological:     Mental Status: She is alert and oriented to person, place, and time.  Psychiatric:        Behavior: Behavior normal.      No results found for any  visits on 12/06/22.     Assessment & Plan:    Routine Health Maintenance and Physical Exam  Immunization History  Administered Date(s) Administered   HPV 9-valent 07/27/2021   HPV Quadrivalent 02/23/2011   Influenza,inj,Quad PF,6+ Mos 04/30/2019   Meningococcal Conjugate 02/23/2011   Tdap 02/23/2011    Health Maintenance  Topic Date Due   COVID-19 Vaccine (1) Never done   Pneumococcal Vaccine 38-32 Years old (1 of 2 - PCV) Never done   PAP-Cervical Cytology Screening  Never done   PAP SMEAR-Modifier  Never done   DTaP/Tdap/Td (2 - Td or Tdap) 02/22/2021   INFLUENZA VACCINE  01/05/2023   CHLAMYDIA SCREENING  09/26/2023   HPV VACCINES  Completed   Hepatitis C Screening  Completed   HIV Screening  Completed    Discussed health benefits of physical activity, and encouraged her to engage in regular exercise appropriate for her age and condition.  Problem List Items Addressed This Visit   None Visit Diagnoses     Wellness examination    -  Primary   Relevant Orders   CBC with Differential/Platelet   COMPLETE METABOLIC PANEL WITH GFR   Screening for cervical cancer       Relevant Orders   Cytology -  PAP       Keep up a regular exercise program and make sure you are eating a healthy diet Try to eat 4 servings of dairy a day, or if you are lactose intolerant take a calcium with vitamin D daily.  Your vaccines are up to date.  Pap smear performed today.  GC chlamydia testing.  Tdap given today. Did encourage smoking cessation.   Return in about 1 year (around 12/06/2023) for Wellness Exam.     [name], MD

## 2022-12-07 LAB — COMPLETE METABOLIC PANEL WITH GFR
AG Ratio: 1.6 (calc) (ref 1.0–2.5)
ALT: 15 U/L (ref 6–29)
AST: 13 U/L (ref 10–30)
Albumin: 4.7 g/dL (ref 3.6–5.1)
Alkaline phosphatase (APISO): 90 U/L (ref 31–125)
BUN/Creatinine Ratio: 11 (calc) (ref 6–22)
BUN: 11 mg/dL (ref 7–25)
CO2: 28 mmol/L (ref 20–32)
Calcium: 9.7 mg/dL (ref 8.6–10.2)
Chloride: 103 mmol/L (ref 98–110)
Creat: 1 mg/dL — ABNORMAL HIGH (ref 0.50–0.96)
Globulin: 3 g/dL (calc) (ref 1.9–3.7)
Glucose, Bld: 85 mg/dL (ref 65–99)
Potassium: 4.5 mmol/L (ref 3.5–5.3)
Sodium: 138 mmol/L (ref 135–146)
Total Bilirubin: 0.4 mg/dL (ref 0.2–1.2)
Total Protein: 7.7 g/dL (ref 6.1–8.1)
eGFR: 81 mL/min/{1.73_m2} (ref >=60)

## 2022-12-07 LAB — CBC WITH DIFFERENTIAL/PLATELET
Absolute Monocytes: 584 cells/uL (ref 200–950)
Basophils Absolute: 48 cells/uL (ref 0–200)
Basophils Relative: 0.6 %
Eosinophils Absolute: 480 cells/uL (ref 15–500)
Eosinophils Relative: 6 %
HCT: 43.6 % (ref 35.0–45.0)
Hemoglobin: 14.6 g/dL (ref 11.7–15.5)
Lymphs Abs: 2368 cells/uL (ref 850–3900)
MCH: 30.9 pg (ref 27.0–33.0)
MCHC: 33.5 g/dL (ref 32.0–36.0)
MCV: 92.4 fL (ref 80.0–100.0)
MPV: 12.7 fL — ABNORMAL HIGH (ref 7.5–12.5)
Monocytes Relative: 7.3 %
Neutro Abs: 4520 cells/uL (ref 1500–7800)
Neutrophils Relative %: 56.5 %
Platelets: 210 10*3/uL (ref 140–400)
RBC: 4.72 10*6/uL (ref 3.80–5.10)
RDW: 12.1 % (ref 11.0–15.0)
Total Lymphocyte: 29.6 %
WBC: 8 10*3/uL (ref 3.8–10.8)

## 2022-12-07 NOTE — Progress Notes (Signed)
Hi Leah Cervantes,  Blood count looks great.  No sign of anemia.  Metabolic panel overall looks good.  Kidney function which is little higher than you normally run.  So it is worth checking that again maybe in 6 months just to make sure that there are no changes.

## 2022-12-09 ENCOUNTER — Other Ambulatory Visit (HOSPITAL_COMMUNITY): Admission: RE | Admit: 2022-12-09 | Payer: 59 | Source: Ambulatory Visit

## 2022-12-09 DIAGNOSIS — Z124 Encounter for screening for malignant neoplasm of cervix: Secondary | ICD-10-CM | POA: Insufficient documentation

## 2022-12-09 NOTE — Addendum Note (Signed)
Addended by: Deno Etienne on: 12/09/2022 11:14 AM   Modules accepted: Orders

## 2022-12-13 LAB — CYTOLOGY - PAP
Chlamydia: NEGATIVE
Comment: NEGATIVE
Comment: NEGATIVE
Comment: NORMAL
Diagnosis: UNDETERMINED — AB
High risk HPV: NEGATIVE
Neisseria Gonorrhea: NEGATIVE

## 2022-12-14 NOTE — Progress Notes (Signed)
Hi Leah Cervantes, Pap smear shows some atypical cells, but no cancer cells.  You are negative for HPV which is reassuring so recommend repeat Pap smear in 1 year.  Negative for gonorrhea and chlamydia.

## 2023-04-25 ENCOUNTER — Ambulatory Visit (INDEPENDENT_AMBULATORY_CARE_PROVIDER_SITE_OTHER): Payer: 59 | Admitting: Family Medicine

## 2023-04-25 VITALS — BP 111/43 | HR 81 | Ht 68.0 in | Wt 207.0 lb

## 2023-04-25 DIAGNOSIS — L732 Hidradenitis suppurativa: Secondary | ICD-10-CM

## 2023-04-25 MED ORDER — DOXYCYCLINE HYCLATE 100 MG PO TABS: 100.0000 mg | ORAL_TABLET | Freq: Every day | ORAL | 1 refills | Status: DC

## 2023-04-25 NOTE — Patient Instructions (Signed)
Recommend wash with a benzyl peroxide soap in the shower.  PanOxyl is usually a pretty easy to find 1. Try to keep the area dry.  Still okay to moisturize after your shower if you would like but just avoid any excess moisture.  If you do break out try to use warm compresses to try to get it to come to ahead and drain on its own.  You can definitely use your anti-inflammatories for pain and discomfort and inflammation.  Follow-up with me in 6 months so we can discuss options once you come off of the doxycycline daily.  Make sure to stop the antibiotic if you are given a different 1 for another condition and then okay to restart afterwards.

## 2023-04-25 NOTE — Progress Notes (Signed)
   Acute Office Visit  Subjective:     Patient ID: Leah Cervantes, female    DOB: 2000/12/07, 22 y.o.   MRN: [id number]  Chief Complaint  Patient presents with   Recurrent Skin Infections    Pt reports that she has been experiencing these for over a year. The last time that this happened was 1 month ago. This was the 3rd time that this has happened this year and that it mostly happens on the upper L inner thigh    HPI Patient is in today for recurring boils and abscesses in the groin crease and inner thigh area.  She says it is really been going on since high school but would only breakout maybe once a year and she was usually able to self manage.  She says sometimes the areas get swollen the size of a tennis ball.  But she says in this past year this is the third time she has had a pretty significant breakout.  Usually she will take an anti-inflammatory to help with discomfort pain and inflammation.  She says a few times she has gotten a little bump or inflammation in the axilla but it does not happen very often and it usually will resolve on its own.  She has had to go to urgent care before for the areas in her groin.  ROS      Objective:    BP (!) 111/43   Pulse 81   Ht 5\' 8"  (1.727 m)   Wt 207 lb (93.9 kg)   LMP 04/10/2023 (Approximate)   SpO2 100%   BMI 31.47 kg/m    Physical Exam Skin:    Comments: She has a few dark areas that have some scabs in the center and some areas that are well-healed that also have a purple discoloration underneath the skin.  One that looks little bit more open like it strained recently is still a little bit of induration.  No active drainage today area affected in both upper inner thighs towards the groin creases.     No results found for any visits on 04/25/23.      Assessment & Plan:   Problem List Items Addressed This Visit   None Visit Diagnoses     Hidradenitis suppurativa    -  Primary        Recommend wash with a benzyl  peroxide soap in the shower.  PanOxyl is usually a pretty easy to find 1. Try to keep the area dry.  Still okay to moisturize after your shower if you would like but just avoid any excess moisture.  If you do break out try to use warm compresses to try to get it to come to ahead and drain on its own.  You can definitely use your anti-inflammatories for pain and discomfort and inflammation.  Follow-up with me in 6 months so we can discuss options once you come off of the doxycycline daily.  Make sure to stop the antibiotic if you are given a different 1 for another condition and then okay to restart afterwards.  Meds ordered this encounter  Medications   doxycycline (VIBRA-TABS) 100 MG tablet    Sig: Take 1 tablet (100 mg total) by mouth daily.    Dispense:  90 tablet    Refill:  1    Return if symptoms worsen or fail to improve.  [name], MD

## 2023-06-29 ENCOUNTER — Telehealth: Payer: Self-pay | Admitting: *Deleted

## 2023-06-29 NOTE — Telephone Encounter (Signed)
Returned call from 8:04 AM x 2. Patient advised per clinical staff what kind of prenatal vitamin to take, to drink 64-72 oz of water per day for hydration, and to call the office if needed.

## 2023-07-06 ENCOUNTER — Telehealth (INDEPENDENT_AMBULATORY_CARE_PROVIDER_SITE_OTHER): Payer: 59 | Admitting: Obstetrics and Gynecology

## 2023-07-06 ENCOUNTER — Encounter: Payer: Self-pay | Admitting: Obstetrics and Gynecology

## 2023-07-06 DIAGNOSIS — Z8759 Personal history of other complications of pregnancy, childbirth and the puerperium: Secondary | ICD-10-CM

## 2023-07-06 NOTE — Progress Notes (Signed)
    GYNECOLOGY VIRTUAL VISIT ENCOUNTER NOTE  Provider location: Center for [facility] [place]   Patient location: Home  I connected with Leah Cervantes on 07/06/23 at 11:10 AM EST by MyChart Video Encounter and verified that I am speaking with the correct person using two identifiers.   I discussed the limitations, risks, security and privacy concerns of performing an evaluation and management service virtually and the availability of in person appointments. I also discussed with the patient that there may be a patient responsible charge related to this service. The patient expressed understanding and agreed to proceed.   History:  Leah Cervantes is a 23 y.o. G55P0010 female being evaluated today for early blood work for history of IUFD. She had an elevated MSAFP and then went for her [place] and no heart beat seen. Concern for was blood clot as etiology due to placental pathology.  ANORA was normal.   She is also nervous because her sister has a history of 6 miscarriages.   She wants to do whatever she can to prevent that from happening again.  She denies any abnormal vaginal discharge, bleeding, pelvic pain or other concerns.       Past Medical History:  Diagnosis Date   Anxiety    Depression    Past Surgical History:  Procedure Laterality Date   NO PAST SURGERIES     The following portions of the patient's history were reviewed and updated as appropriate: allergies, current medications, past family history, past medical history, past social history, past surgical history and problem list.   Review of Systems:  Pertinent items noted in HPI and remainder of comprehensive ROS otherwise negative.  Physical Exam:   General:  Alert, oriented and cooperative. Patient appears to be in no acute distress.  Mental Status: Normal mood and affect. Normal behavior. Normal judgment and thought content.   Respiratory: Normal respiratory effort, no problems with respiration  noted  Rest of physical exam deferred due to type of encounter  Labs and Imaging No results found for this or any previous visit (from the past 2 weeks). No results found.     Assessment and Plan:     1. History of IUFD (Primary) Reviewed assuming blood work is all normal for APS, she is not at an increased risk of recurrence. We will check the labs along with TSH and A1C.  Discussed ldASA at 12-14 weeks.  Reviewed we would once again check MSAFP at 16 weeks.  Will do [place] at 8 weeks for her reassurance and due to history. She also has NOB scheduled for 10 weeks.  - Beta-2 Glycoprotein I Ab,G/M - Cardiolipin antibodies, IgM+IgG - Lupus anticoagulant - TSH - Hemoglobin A1c - US OB LESS THAN 14 WEEKS WITH OB TRANSVAGINAL; Future       I discussed the assessment and treatment plan with the patient. The patient was provided an opportunity to ask questions and all were answered. The patient agreed with the plan and demonstrated an understanding of the instructions.   The patient was advised to call back or seek an in-person evaluation/go to the ED if the symptoms worsen or if the condition fails to improve as anticipated.  I provided 10 minutes of face-to-face time during this encounter.   Milas Hock, MD Center for [facility], [facility]

## 2023-07-17 LAB — LUPUS ANTICOAGULANT
Dilute Viper Venom Time: 27.4 s (ref 0.0–47.0)
PTT Lupus Anticoagulant: 32.8 s (ref 0.0–43.5)
Thrombin Time: 16.4 s (ref 0.0–23.0)
dPT Confirm Ratio: 0.89 {ratio} (ref 0.00–1.34)
dPT: 26.4 s (ref 0.0–47.6)

## 2023-07-17 LAB — CARDIOLIPIN ANTIBODIES, IGM+IGG
Anticardiolipin IgG: <9 [GPL'U]/mL (ref 0–14)
Anticardiolipin IgM: <9 [MPL'U]/mL (ref 0–12)

## 2023-07-17 LAB — BETA-2 GLYCOPROTEIN I AB,G/M
Beta-2 Glyco 1 IgM: <9 GPI IgM units (ref 0–32)
Beta-2 Glyco I IgG: <9 GPI IgG units (ref 0–20)

## 2023-07-17 LAB — TSH: TSH: 1.38 u[IU]/mL (ref 0.450–4.500)

## 2023-07-17 LAB — HEMOGLOBIN A1C
Est. average glucose Bld gHb Est-mCnc: 105 mg/dL
Hgb A1c MFr Bld: 5.3 % (ref 4.8–5.6)

## 2023-07-18 ENCOUNTER — Encounter: Payer: Self-pay | Admitting: Obstetrics and Gynecology

## 2023-08-04 ENCOUNTER — Encounter: Payer: Self-pay | Admitting: *Deleted

## 2023-08-04 DIAGNOSIS — Z3490 Encounter for supervision of normal pregnancy, unspecified, unspecified trimester: Secondary | ICD-10-CM | POA: Insufficient documentation

## 2023-08-07 ENCOUNTER — Ambulatory Visit (INDEPENDENT_AMBULATORY_CARE_PROVIDER_SITE_OTHER): Payer: 59 | Admitting: Obstetrics & Gynecology

## 2023-08-07 ENCOUNTER — Ambulatory Visit (INDEPENDENT_AMBULATORY_CARE_PROVIDER_SITE_OTHER)

## 2023-08-07 ENCOUNTER — Encounter: Payer: Self-pay | Admitting: Obstetrics & Gynecology

## 2023-08-07 VITALS — BP 130/74 | HR 67 | Wt 211.0 lb

## 2023-08-07 DIAGNOSIS — Z8759 Personal history of other complications of pregnancy, childbirth and the puerperium: Secondary | ICD-10-CM | POA: Diagnosis not present

## 2023-08-07 DIAGNOSIS — O09291 Supervision of pregnancy with other poor reproductive or obstetric history, first trimester: Secondary | ICD-10-CM

## 2023-08-07 DIAGNOSIS — Z3481 Encounter for supervision of other normal pregnancy, first trimester: Secondary | ICD-10-CM | POA: Diagnosis not present

## 2023-08-07 DIAGNOSIS — Z3490 Encounter for supervision of normal pregnancy, unspecified, unspecified trimester: Secondary | ICD-10-CM

## 2023-08-07 DIAGNOSIS — Z3A10 10 weeks gestation of pregnancy: Secondary | ICD-10-CM | POA: Diagnosis not present

## 2023-08-07 NOTE — Progress Notes (Signed)
 Subjective:    Leah Cervantes is a G2P0010 [redacted]w[redacted]d being seen today for her first obstetrical visit.  Her obstetrical history is significant for  19 week IUFD . Patient does intend to breast feed. Pregnancy history fully reviewed.  Patient reports  mild anxiety when she thinks about 19 week miscarriage.  Declines counseling and medications at this time.  Depression is under control without meds.  .  Vitals:   08/07/23 1319  BP: 130/74  Pulse: 67  Weight: 211 lb (95.7 kg)    HISTORY: OB History  Gravida Para Term Preterm AB Living  2 0   1   SAB IAB Ectopic Multiple Live Births          # Outcome Date GA Lbr Len/2nd Weight Sex Type Anes PTL Lv  2 Current           1 AB  [redacted]w[redacted]d      N FD   Past Medical History:  Diagnosis Date   Anxiety    Depression    Past Surgical History:  Procedure Laterality Date   NO PAST SURGERIES     Family History  Problem Relation Age of Onset   Hearing loss Mother    Hypertension Mother    Fibromyalgia Sister    Stroke Maternal Grandmother    Heart disease Maternal Grandmother    Hypertension Maternal Grandmother    Fibromyalgia Maternal Grandfather    Raynaud syndrome Paternal Grandmother    Heart attack Paternal Grandfather    Bipolar disorder Maternal Uncle    Depression Father      Exam    Uterus:     Pelvic Exam:    Perineum: No Hemorrhoids   Vulva: normal   Vagina:  normal mucosa, normal discharge   pH: N/a   Cervix: no lesions   Adnexa: normal adnexa   Bony Pelvis: average  System: Breast:  normal appearance, no masses or tenderness   Skin: normal coloration and turgor, no rashes    Neurologic: oriented, normal mood   Extremities: no deformities   HEENT sclera clear, anicteric, oropharynx clear, no lesions, neck supple with midline trachea, thyroid without masses, and trachea midline   Mouth/Teeth mucous membranes moist, pharynx normal without lesions and dental hygiene good   Neck supple and no masses    Cardiovascular: regular rate and rhythm   Respiratory:  appears well, vitals normal, no respiratory distress, acyanotic, normal RR, neck free of mass or lymphadenopathy, chest clear, no wheezing, crepitations, rhonchi, normal symmetric air entry   Abdomen: soft, non-tender; bowel sounds normal; no masses,  no organomegaly   Urinary: urethral meatus normal      Assessment:    Pregnancy: G2P0010 Patient Active Problem List   Diagnosis Date Noted   Supervision of normal pregnancy 08/04/2023   History of IUFD 09/10/2020   Lumbar radiculopathy 03/26/2020   MDD (major depressive disorder), recurrent episode, severe (HCC) 11/09/2018   Paresthesias 12/06/2017   Vestibular migraine 12/06/2017   Depression, recurrent (HCC) 08/26/2016   GAD (generalized anxiety disorder) 08/26/2016   Raynaud's disease without gangrene 06/13/2016        Plan:     Initial labs drawn. Prenatal vitamins. Problem list reviewed and updated. Genetic Screening discussed:  Horizon, Panorama and AFP (16 weeks)  Ultrasound discussed; fetal survey: 18-20 weeks Hx 15 week IUFD (discovered at 19 weeks):  AFP was elevated during that pregnancy. TSH, Hgb A1C and APLA workup was negative.  Anxiety/Depression--will survey several times through pregnancy.  Leah Cervantes is aware that untreated anxiety and depression can lead to adverse pregnancy outcomes.  She will keep [place] updated on her symptoms.   Will start low dose ASA at 14 weeks on her return visit.  Baby Rx App only  Follow up in 4 weeks.  Leah Cervantes 08/07/2023

## 2023-08-09 LAB — PREGNANCY, INITIAL SCREEN
Antibody Screen: NEGATIVE
Basophils Absolute: 0 10*3/uL (ref 0.0–0.2)
Basos: 0 %
Bilirubin, UA: NEGATIVE
Chlamydia trachomatis, NAA: NEGATIVE
EOS (ABSOLUTE): 0.2 10*3/uL (ref 0.0–0.4)
Eos: 2 %
Glucose, UA: NEGATIVE
HCV Ab: NONREACTIVE
HIV Screen 4th Generation wRfx: NONREACTIVE
Hematocrit: 38.2 % (ref 34.0–46.6)
Hemoglobin: 12.9 g/dL (ref 11.1–15.9)
Hepatitis B Surface Ag: NEGATIVE
Immature Grans (Abs): 0 10*3/uL (ref 0.0–0.1)
Immature Granulocytes: 0 %
Ketones, UA: NEGATIVE
Leukocytes,UA: NEGATIVE
Lymphocytes Absolute: 1.8 10*3/uL (ref 0.7–3.1)
Lymphs: 20 %
MCH: 31.2 pg (ref 26.6–33.0)
MCHC: 33.8 g/dL (ref 31.5–35.7)
MCV: 93 fL (ref 79–97)
Monocytes Absolute: 0.5 10*3/uL (ref 0.1–0.9)
Monocytes: 6 %
Neisseria Gonorrhoeae by PCR: NEGATIVE
Neutrophils Absolute: 6.5 10*3/uL (ref 1.4–7.0)
Neutrophils: 72 %
Nitrite, UA: NEGATIVE
Platelets: 274 10*3/uL (ref 150–450)
Protein,UA: NEGATIVE
RBC, UA: NEGATIVE
RBC: 4.13 x10E6/uL (ref 3.77–5.28)
RDW: 12.1 % (ref 11.7–15.4)
RPR Ser Ql: NONREACTIVE
Rh Factor: POSITIVE
Rubella Antibodies, IGG: 1.14 {index} (ref >0.99)
Specific Gravity, UA: 1.012 (ref 1.005–1.030)
Urobilinogen, Ur: 0.2 mg/dL (ref 0.2–1.0)
WBC: 9 10*3/uL (ref 3.4–10.8)
pH, UA: 7.5 (ref 5.0–7.5)

## 2023-08-09 LAB — MICROSCOPIC EXAMINATION
Bacteria, UA: NONE SEEN
Casts: NONE SEEN /LPF
RBC, Urine: NONE SEEN /HPF (ref 0–2)

## 2023-08-09 LAB — URINE CULTURE, OB REFLEX

## 2023-08-09 LAB — HCV INTERPRETATION

## 2023-08-11 ENCOUNTER — Encounter: Payer: Self-pay | Admitting: Obstetrics & Gynecology

## 2023-08-20 LAB — PANORAMA PRENATAL TEST FULL PANEL:PANORAMA TEST PLUS 5 ADDITIONAL MICRODELETIONS: FETAL FRACTION: 5.8

## 2023-08-23 LAB — HORIZON CUSTOM: REPORT SUMMARY: NEGATIVE

## 2023-09-05 ENCOUNTER — Encounter: Admitting: Obstetrics and Gynecology

## 2024-01-25 ENCOUNTER — Ambulatory Visit: Admission: EM | Admit: 2024-01-25 | Discharge: 2024-01-25 | Disposition: A | Discharge status: Home / Self Care

## 2024-01-25 ENCOUNTER — Other Ambulatory Visit: Payer: Self-pay

## 2024-01-25 DIAGNOSIS — L0291 Cutaneous abscess, unspecified: Secondary | ICD-10-CM

## 2024-01-25 DIAGNOSIS — Z3A34 34 weeks gestation of pregnancy: Secondary | ICD-10-CM

## 2024-01-25 MED ORDER — AMOXICILLIN-POT CLAVULANATE 875-125 MG PO TABS: 1.0000 | ORAL_TABLET | Freq: Two times a day (BID) | ORAL | 0 refills | Status: DC

## 2024-01-25 MED ORDER — ACETAMINOPHEN 500 MG PO TABS: 500.0000 mg | ORAL_TABLET | Freq: Four times a day (QID) | ORAL | 0 refills | Status: DC | PRN

## 2024-01-25 NOTE — Discharge Instructions (Addendum)
 Today we drained your abscess to your right inner thigh.  Continue to do warm compresses with water and an antibacterial solution such as Dial soap or Hibiclens.  You can take Tylenol  as needed for pain.  Take the Augmentin  twice daily with food.  Abscess should start to feel better and shrink in size.  If you notice any changes or no improvement follow-up with primary care provider, OB/GYN or return to clinic for reevaluation.

## 2024-01-25 NOTE — ED Triage Notes (Signed)
 Right upper thigh abscess x 1 week. Redness, swelling, pain. Has been doing warm compresses and warm soaks. Is [redacted] weeks pregnant.

## 2024-01-25 NOTE — ED Provider Notes (Signed)
 [facility]    CSN: [id number] Arrival date & time: 01/25/24  1415      History   Chief Complaint Chief Complaint  Patient presents with   Abscess    HPI Leah Cervantes is a 23 y.o. female.   Patient presents to clinic over concern of a right upper inner thigh abscess that has been present for the past week.  Does have a history of reoccurring abscesses that have been treated with doxycycline  in the past.  The area is erythematous with central fluctuance and surrounding induration.  Has been doing warm soaks.  Initially thought maybe there were 2 heads, the area has continued to grow in size and it is painful.  Patient is [redacted] weeks pregnant.  No antibiotic allergies.  Has not taken any medication, she is pregnant and is unsure what she can take.  The history is provided by the patient and medical records.  Abscess   Past Medical History:  Diagnosis Date   Anxiety    Depression     Patient Active Problem List   Diagnosis Date Noted   Supervision of normal pregnancy 08/04/2023   History of IUFD 09/10/2020   Lumbar radiculopathy 03/26/2020   MDD (major depressive disorder), recurrent episode, severe (HCC) 11/09/2018   Paresthesias 12/06/2017   Vestibular migraine 12/06/2017   Depression, recurrent (HCC) 08/26/2016   GAD (generalized anxiety disorder) 08/26/2016   Raynaud's disease without gangrene 06/13/2016    Past Surgical History:  Procedure Laterality Date   NO PAST SURGERIES      OB History     Gravida  2   Para  0   Term      Preterm      AB  1   Living         SAB      IAB      Ectopic      Multiple      Live Births               Home Medications    Prior to Admission medications   Medication Sig Start Date End Date Taking? Authorizing Provider  acetaminophen  (TYLENOL ) 500 MG tablet Take 1 tablet (500 mg total) by mouth every 6 (six) hours as needed. 01/25/24  Yes Dion Sibal  N, FNP  amoxicillin -clavulanate  (AUGMENTIN ) 875-125 MG tablet Take 1 tablet by mouth every 12 (twelve) hours. 01/25/24  Yes Mylasia Vorhees  N, FNP  aspirin  EC 81 MG tablet Take 81 mg by mouth daily. Swallow whole.   Yes [provider]  Prenatal Vit-Fe Fumarate-FA (MULTIVITAMIN-PRENATAL) 27-0.8 MG TABS tablet Take 1 tablet by mouth daily at 12 noon.    [provider]    Family History Family History  Problem Relation Age of Onset   Hearing loss Mother    Hypertension Mother    Fibromyalgia Sister    Stroke Maternal Grandmother    Heart disease Maternal Grandmother    Hypertension Maternal Grandmother    Fibromyalgia Maternal Grandfather    Raynaud syndrome Paternal Grandmother    Heart attack Paternal Grandfather    Bipolar disorder Maternal Uncle    Depression Father     Social History Social History   Tobacco Use   Smoking status: Former   Smokeless tobacco: Former    Types: Chew   Tobacco comments:    Occ smoker. Vape   Vaping Use   Vaping status: Every Day   Substances: Nicotine , Flavoring  Substance Use Topics  Alcohol use: Not Currently    Comment: occasional/very rare   Drug use: No     Allergies   Buspar  [buspirone ] and Prednisone    Review of Systems Review of Systems  Per HPI  Physical Exam Triage Vital Signs ED Triage Vitals  Encounter Vitals Group     BP 01/25/24 1449 118/76     Girls Systolic BP Percentile --      Girls Diastolic BP Percentile --      Boys Systolic BP Percentile --      Boys Diastolic BP Percentile --      Pulse Rate 01/25/24 1449 78     Resp 01/25/24 1449 16     Temp 01/25/24 1449 98.6 F (37 C)     Temp src --      SpO2 01/25/24 1449 99 %     Weight --      Height --      Head Circumference --      Peak Flow --      Pain Score 01/25/24 1452 4     Pain Loc --      Pain Education --      Exclude from Growth Chart --    No data found.  Updated Vital Signs BP 118/76   Pulse 78   Temp 98.6 F (37 C)   Resp 16   LMP  05/28/2023   SpO2 99%   Visual Acuity Right Eye Distance:   Left Eye Distance:   Bilateral Distance:    Right Eye Near:   Left Eye Near:    Bilateral Near:     Physical Exam Vitals and nursing note reviewed.  Constitutional:      Appearance: Normal appearance.  HENT:     Head: Normocephalic and atraumatic.     Right Ear: External ear normal.     Left Ear: External ear normal.     Nose: Nose normal.     Mouth/Throat:     Mouth: Mucous membranes are moist.  Eyes:     Conjunctiva/sclera: Conjunctivae normal.  Cardiovascular:     Rate and Rhythm: Normal rate.  Pulmonary:     Effort: Pulmonary effort is normal. No respiratory distress.  Skin:    General: Skin is warm and dry.     Findings: Abscess present.      Neurological:     General: No focal deficit present.     Mental Status: She is alert and oriented to person, place, and time.  Psychiatric:        Mood and Affect: Mood normal.        Behavior: Behavior normal. Behavior is cooperative.      UC Treatments / Results  Labs (all labs ordered are listed, but only abnormal results are displayed) Labs Reviewed - No data to display  EKG   Radiology No results found.  Procedures Incision and Drainage  Date/Time: 01/25/2024 3:28 PM  Performed by: Dreama, Giovonnie Trettel  N, FNP Authorized by: Dreama, Zyaira Vejar  LOISE, FNP   Consent:    Consent obtained:  Verbal   Consent given by:  Patient   Risks, benefits, and alternatives were discussed: yes     Risks discussed:  Bleeding, incomplete drainage and pain   Alternatives discussed:  Observation Universal protocol:    Procedure explained and questions answered to patient or proxy's satisfaction: yes     Patient identity confirmed:  Verbally with patient Location:    Type:  Abscess   Size:  8 cm x  8 cm   Location:  Lower extremity   Lower extremity location:  Leg   Leg location:  R upper leg Pre-procedure details:    Skin preparation:  Povidone-iodine Anesthesia:     Anesthesia method:  Topical application   Topical anesthesia: Pain ease spray. Procedure type:    Complexity:  Simple Procedure details:    Incision types:  Single straight   Drainage:  Purulent   Drainage amount:  Moderate   Wound treatment:  Wound left open   Packing materials:  None Post-procedure details:    Procedure completion:  Tolerated well, no immediate complications  (including critical care time)  Medications Ordered in UC Medications - No data to display  Initial Impression / Assessment and Plan / UC Course  I have reviewed the triage vital signs and the nursing notes.  Pertinent labs & imaging results that were available during my care of the patient were reviewed by me and considered in my medical decision making (see chart for details).  Vitals and triage reviewed, patient is hemodynamically stable.  Fluctuant abscess with surrounding induration to the right upper inner thigh.  Area cleaned, Will place on Augmentin  numbed topically and drained.  See procedure note for further details.  For abscess during pregnancy, no associated major congenital malformations.  Pain management with Tylenol  discussed.  Wound care post I&D reviewed.  Plan of care, follow-up care return precautions given, no questions at this time.  Work note provided.     Final Clinical Impressions(s) / UC Diagnoses   Final diagnoses:  Abscess     Discharge Instructions      Today we drained your abscess to your right inner thigh.  Continue to do warm compresses with water and an antibacterial solution such as Dial soap or Hibiclens.  You can take Tylenol  as needed for pain.  Take the Augmentin  twice daily with food.  Abscess should start to feel better and shrink in size.  If you notice any changes or no improvement follow-up with primary care provider, OB/GYN or return to clinic for reevaluation.    ED Prescriptions     Medication Sig Dispense Auth. Provider   amoxicillin -clavulanate  (AUGMENTIN ) 875-125 MG tablet Take 1 tablet by mouth every 12 (twelve) hours. 14 tablet Taryn Nave  N, FNP   acetaminophen  (TYLENOL ) 500 MG tablet Take 1 tablet (500 mg total) by mouth every 6 (six) hours as needed. 30 tablet Dreama, Laird Runnion  N, FNP      PDMP not reviewed this encounter.   Dreama, Claire Dolores  N, FNP 01/25/24 1529

## 2024-06-24 ENCOUNTER — Ambulatory Visit: Admitting: Family Medicine

## 2024-06-24 ENCOUNTER — Encounter: Payer: Self-pay | Admitting: Family Medicine

## 2024-06-24 VITALS — BP 128/67 | HR 75 | Ht 68.0 in | Wt 214.8 lb

## 2024-06-24 DIAGNOSIS — Z23 Encounter for immunization: Secondary | ICD-10-CM

## 2024-06-24 DIAGNOSIS — F53 Postpartum depression: Secondary | ICD-10-CM | POA: Insufficient documentation

## 2024-06-24 MED ORDER — DULOXETINE HCL 30 MG PO CPEP: ORAL_CAPSULE | ORAL | 1 refills | Status: AC

## 2024-06-24 NOTE — Progress Notes (Signed)
 "  Acute Office Visit  Patient ID: Leah Cervantes, female    DOB: Apr 07, 2001, 24 y.o.   MRN: [id number]  PCP: Alvan Dorothyann BIRCH, MD  Chief Complaint  Patient presents with   postpartum depression    Subjective:     HPI  Discussed the use of AI scribe software for clinical note transcription with the patient, who gave verbal consent to proceed.  History of Present Illness Leah Cervantes is a 24 year old female who presents with postpartum emotional struggles and anxiety.  Postpartum emotional distress and anxiety - Significant emotional struggles and anxiety since childbirth on February 18, 2024 - Feels overwhelmed and has difficulty being present with her baby and partner - Attributes emotional distress to fear of attachment and potential loss - Struggles with motivation and experiences rapid emotional spirals on bad days - Acknowledges some good days but finds it challenging to maintain consistency - Desires to be more present and involved with her baby and partner - Recognizes need for additional support to manage anxiety and emotional well-being  Sleep patterns - Averages six to eight hours of sleep per night, depending on baby's sleep schedule - Baby typically wakes once during the night and starts the day between 5 AM and 7 AM  Feeding practices - Initially attempted breastfeeding but found it stressful - Transitioned to formula feeding, which has been easier - No longer breastfeeding  Psychotropic medication history - Previously used duloxetine  for similar symptoms, with doses ranging from 30 mg to 120 mg - Tapered off duloxetine  in the past - Recalls 60 mg as a therapeutic dose  Family and social support - Lives with her partner, who has two children from a previous relationship - Partner is very supportive and involved in family activities - Partner has taken on many responsibilities, leading to feelings of guilt for not contributing  equally   ROS     Objective:    BP 128/67   Pulse 75   Ht 5' 8 (1.727 m)   Wt 214 lb 12.8 oz (97.4 kg)   LMP 06/15/2024 (Exact Date)   SpO2 97%   Breastfeeding No   BMI 32.66 kg/m    Physical Exam Vitals reviewed.  Constitutional:      Appearance: Normal appearance.  HENT:     Head: Normocephalic.  Pulmonary:     Effort: Pulmonary effort is normal.  Neurological:     Mental Status: She is alert and oriented to person, place, and time.  Psychiatric:        Mood and Affect: Mood normal.        Behavior: Behavior normal.       No results found for any visits on 06/24/24.     Assessment & Plan:   Problem List Items Addressed This Visit       Other   Post partum depression - Primary   Relevant Medications   DULoxetine  (CYMBALTA ) 30 MG capsule   Other Visit Diagnoses       Encounter for immunization       Relevant Orders   Flu vaccine trivalent PF, 6mos and older(Flulaval,Afluria,Fluarix,Fluzone) (Completed)       Assessment and Plan Assessment & Plan Postpartum depression Experiencing postpartum depression with symptoms of low motivation, fear of attachment, and anxiety. Not breastfeeding, allowing for medication flexibility. Duloxetine  previously effective at 60 mg. - Prescribed duloxetine  30 mg daily for 10-14 days, then increase to 60 mg daily. - Scheduled follow-up in 6 weeks  to assess medication response and adjust treatment. - Encouraged therapy to address trauma and anxiety.  She wants to think about it.  Will address again at 6-week follow-up - PHQ-9 score of 14 and GAD-7 score of 10.  No thoughts of wanting to harm herself.    Meds ordered this encounter  Medications   DULoxetine  (CYMBALTA ) 30 MG capsule    Sig: Take 1 capsule (30 mg total) by mouth daily for 10 days, THEN 2 capsules (60 mg total) daily for 20 days.    Dispense:  60 capsule    Refill:  1    Return in about 6 weeks (around 08/05/2024) for Mood.  Dorothyann Byars,  MD [facility] at [facility]   "

## 2024-06-24 NOTE — Progress Notes (Signed)
 Pt reports that she had a good pregnancy overall. There was concern because of the [place]  stating that he was small his birth wt was 5lb 6 oz however he did drop wt  so she was trying to breastfeed. She stated that breastfeeding was really hard. She is not currently breastfeeding now.  She is concerned about being a good mother and being present for her son because of her previous child's death.   She stated that she has also been irritable.
